# Patient Record
Sex: Female | Born: 1960 | Race: Black or African American | Hispanic: No | Marital: Married | Smoking: Former smoker
Health system: Southern US, Community
[De-identification: ages and names within clinical notes are randomized; demographics above are authoritative.]

## PROBLEM LIST (undated history)

## (undated) DIAGNOSIS — E7800 Pure hypercholesterolemia, unspecified: Secondary | ICD-10-CM

## (undated) DIAGNOSIS — I10 Essential (primary) hypertension: Secondary | ICD-10-CM

## (undated) DIAGNOSIS — K219 Gastro-esophageal reflux disease without esophagitis: Secondary | ICD-10-CM

## (undated) HISTORY — PX: TUBAL LIGATION: SHX77

## (undated) HISTORY — PX: BREAST BIOPSY: SHX20

---

## 2006-01-19 ENCOUNTER — Ambulatory Visit: Payer: Self-pay | Admitting: Internal Medicine

## 2006-05-02 ENCOUNTER — Emergency Department: Payer: Self-pay | Admitting: Emergency Medicine

## 2006-09-28 ENCOUNTER — Ambulatory Visit: Payer: Self-pay | Admitting: Surgery

## 2007-03-08 ENCOUNTER — Emergency Department: Payer: Self-pay | Admitting: Emergency Medicine

## 2007-04-28 ENCOUNTER — Ambulatory Visit: Payer: Self-pay

## 2007-12-16 ENCOUNTER — Ambulatory Visit: Payer: Self-pay | Admitting: Gastroenterology

## 2008-05-01 ENCOUNTER — Ambulatory Visit: Payer: Self-pay | Admitting: Internal Medicine

## 2009-05-09 ENCOUNTER — Ambulatory Visit: Payer: Self-pay | Admitting: Internal Medicine

## 2009-11-29 ENCOUNTER — Ambulatory Visit: Payer: Self-pay | Admitting: Ophthalmology

## 2010-10-10 ENCOUNTER — Ambulatory Visit: Payer: Self-pay | Admitting: Family Medicine

## 2011-11-19 ENCOUNTER — Ambulatory Visit: Payer: Self-pay | Admitting: Internal Medicine

## 2012-07-02 ENCOUNTER — Ambulatory Visit: Payer: Self-pay | Admitting: Family Medicine

## 2012-12-24 DIAGNOSIS — R079 Chest pain, unspecified: Secondary | ICD-10-CM | POA: Insufficient documentation

## 2013-01-13 ENCOUNTER — Observation Stay: Payer: Self-pay | Admitting: Internal Medicine

## 2013-01-13 LAB — BASIC METABOLIC PANEL
Anion Gap: 6 — ABNORMAL LOW (ref 7–16)
BUN: 20 mg/dL — ABNORMAL HIGH (ref 7–18)
Calcium, Total: 9.6 mg/dL (ref 8.5–10.1)
Chloride: 105 mmol/L (ref 98–107)
Co2: 27 mmol/L (ref 21–32)
Creatinine: 0.92 mg/dL (ref 0.60–1.30)
EGFR (African American): >60
EGFR (Non-African Amer.): >60
Glucose: 98 mg/dL (ref 65–99)
Osmolality: 278 (ref 275–301)
Potassium: 3.4 mmol/L — ABNORMAL LOW (ref 3.5–5.1)
Sodium: 138 mmol/L (ref 136–145)

## 2013-01-13 LAB — CBC
HCT: 34.5 % — ABNORMAL LOW (ref 35.0–47.0)
MCHC: 33.2 g/dL (ref 32.0–36.0)
Platelet: 339 10*3/uL (ref 150–440)
RBC: 5.03 10*6/uL (ref 3.80–5.20)
RDW: 33.7 % — ABNORMAL HIGH (ref 11.5–14.5)
WBC: 6 10*3/uL (ref 3.6–11.0)

## 2013-01-14 LAB — TROPONIN I: Troponin-I: <0.02 ng/mL

## 2013-01-14 LAB — CK TOTAL AND CKMB (NOT AT ARMC): CK-MB: 0.7 ng/mL (ref 0.5–3.6)

## 2013-01-17 ENCOUNTER — Ambulatory Visit: Payer: Self-pay | Admitting: Family Medicine

## 2013-05-26 HISTORY — PX: COLONOSCOPY: SHX174

## 2013-05-27 LAB — HM COLONOSCOPY: HM Colonoscopy: NORMAL

## 2013-11-23 DIAGNOSIS — D649 Anemia, unspecified: Secondary | ICD-10-CM | POA: Insufficient documentation

## 2014-01-24 LAB — HM PAP SMEAR: HM Pap smear: NORMAL

## 2014-05-09 ENCOUNTER — Ambulatory Visit: Payer: Self-pay | Admitting: Internal Medicine

## 2014-05-11 ENCOUNTER — Ambulatory Visit: Payer: Self-pay | Admitting: Gastroenterology

## 2014-05-15 ENCOUNTER — Ambulatory Visit: Payer: Self-pay | Admitting: Internal Medicine

## 2014-05-23 ENCOUNTER — Ambulatory Visit: Payer: Self-pay | Admitting: Internal Medicine

## 2014-05-23 LAB — HM MAMMOGRAPHY

## 2014-09-06 DIAGNOSIS — M1990 Unspecified osteoarthritis, unspecified site: Secondary | ICD-10-CM | POA: Insufficient documentation

## 2014-09-06 DIAGNOSIS — R569 Unspecified convulsions: Secondary | ICD-10-CM | POA: Insufficient documentation

## 2014-09-08 ENCOUNTER — Other Ambulatory Visit: Payer: Self-pay | Admitting: Surgery

## 2014-09-08 DIAGNOSIS — N63 Unspecified lump in unspecified breast: Secondary | ICD-10-CM

## 2014-09-08 DIAGNOSIS — I10 Essential (primary) hypertension: Secondary | ICD-10-CM | POA: Insufficient documentation

## 2014-09-08 DIAGNOSIS — F32A Depression, unspecified: Secondary | ICD-10-CM | POA: Insufficient documentation

## 2014-09-08 DIAGNOSIS — F329 Major depressive disorder, single episode, unspecified: Secondary | ICD-10-CM | POA: Insufficient documentation

## 2014-09-15 NOTE — Discharge Summary (Signed)
PATIENT NAME:  Bianca Wood, Bianca Wood MR#:  [id number] DATE OF BIRTH:  1961/01/04  DATE OF ADMISSION:  01/13/2013 DATE OF DISCHARGE:  01/14/2013  PRESENTING COMPLAINT: Chest pain.   DISCHARGE DIAGNOSES:  1. Chest pain, appears noncardiac.  2. Hypertension.   PROCEDURES: Treadmill stress test: Negative.   CODE STATUS: Full code.   MEDICATIONS:  1. Hydrochlorothiazide 25 mg daily.  2. Quinapril 40 mg daily.  3. Amlodipine 10 mg daily.  4. Losartan 100 mg daily.   DIET: Low sodium.   FOLLOWUP: With Dr. Gayland Curry in 1 to 2 weeks.  DIAGNOSTIC STUDIES: Cardiac enzymes x3 negative.  EKG shows normal sinus rhythm with incomplete right bundle branch block.  H and H is 11.5 and 34.5, white count is 6.0, MCV 69.  Potassium is 3.4, BUN is 20, creatinine is 0.9, sodium is 138.   BRIEF SUMMARY OF HOSPITAL COURSE: Bianca Wood is a pleasant 54 year old African-American female with history of hypertension, who came in with:   1. Chest pain. The patient's workup for chest pain remained negative. She did not have any further episodes. Cardiac enzymes remained negative. She had a normal treadmill stress test. Her chest pain appears noncardiac, most likely related to stress.  2. Hypertension. All home medications were resumed.  3. Iron deficiency anemia, suspected from menorrhagia from known history of uterine fibroids. The patient is advised to follow up with her gynecologist.  [facility] stay otherwise remained stable.   CODE STATUS: The patient remained a full code.   TIME SPENT: 40 minutes.   ____________________________ Hart Rochester Posey Pronto, MD sap:OSi D: 01/15/2013 07:16:27 ET T: 01/15/2013 08:31:27 ET JOB#: [id number]  cc: Leoma Folds A. Posey Pronto, MD, <Dictator> Floria Raveling. Astrid Divine, MD Ilda Basset MD ELECTRONICALLY SIGNED 01/27/2013 62:83

## 2014-09-15 NOTE — H&P (Signed)
PATIENT NAME:  Bianca Wood, Bianca Wood MR#:  [id number] DATE OF BIRTH:  08/16/1960  DATE OF ADMISSION:  01/13/2013  PRIMARY PHYSICIAN:  Dr. Gayland Curry  CHIEF COMPLAINT:  Chest pain.   HISTORY OF PRESENT ILLNESS:  A 54 year old female with history of hypertension, noticed to have chest pain, mainly in the middle of the chest, going across the chest to her left shoulder. This was going on for about 1 to 2 months. The patient went to her primary doctor, Gayland Curry, 2 weeks ago, had a physical done and supposed to have a stress test in 2 weeks. The patient does not remember the cardiologist's name. The patient since then is having on and off chest pain[place] Whenever she gets chest pressure she usually is  at home, and the chest pain would last for about 5 minutes and after that it resolved, but this happened today while she was driving back from work, and noticed heaviness in the chest with slight nausea and shortness of breath, so she came to Emergency Room. In the ER, workup is negative, but because of her chest pain and the [place], the patient is referred for admission. The patient right now is chest pain free. Received aspirin in the Emergency Room. Denies any orthopnea or PND. No palpitations.   PAST MEDICAL HISTORY:  Significant for hypertension.   PAST SURGICAL HISTORY:  Significant for tubal ligation.   MEDICATIONS: Takes hydrochlorothiazide. The patient does not remember the dose.   FAMILY HISTORY:  Significant for heart disease. The patient's mother died at the age of 13 because of heart attack. Maternal grandmother died of heart attack, and paternal grandmother died of heart attack.   SOCIAL HISTORY:  No smoking. No drinking. No drugs. Lives with her husband works at [employer].>   REVIEW OF SYSTEMS: CONSTITUTIONAL:  The patient has no fever. No fatigue.  EYES:  No blurred vision.  EARS, NOSE, THROAT:  No tinnitus. No ear pain. No  epistaxis.  RESPIRATORY:  No cough. No wheezing.  CARDIOVASCULAR:  Has chest pain on and off going on for about 2 months.  GASTROINTESTINAL:  No nausea. No vomiting. No abdominal pain.  GENITOURINARY:  No dysuria.  ENDOCRINE:  No polyuria or nocturia.  INTEGUMENTARY:  No skin rashes.  MUSCULOSKELETAL:  No joint pains.  NEUROLOGIC:  No numbness or weakness. No TIA. PSYCHIATRIC:  No anxiety or insomnia.   PHYSICAL EXAMINATION: VITAL SIGNS:  Temperature 98.2, heart rate 87, blood pressure 146/70, respirations 20, sats 96% on room air.  GENERAL:  This is a well-developed, well-nourished African-American female not in distress, answering questions appropriately.  HEENT: Head normocephalic, atraumatic. Pupils equally reacting to light. Extraocular movements are intact. No conjunctival icterus.  NOSE:  No turbinate hypertrophy.  EARS:  No tympanic membrane congestion or.  MOUTH:  No oropharyngeal erythema.  NECK:  Supple. No JVD. No carotid bruit. No lymphadenopathy.  RESPIRATORY:  Clear to auscultation. No wheeze. No rales. Not using accessory muscles of respiration.  CARDIOVASCULAR:  S1, S2. Regular. No murmurs. PMI not displaced. Pulses equal at carotid, femoral and pedal pulses. No peripheral edema.  GASTROINTESTINAL: Abdomen is soft, nontender, obese. Bowel sounds present. No organomegaly. No CVA tenderness.  MUSCULOSKELETAL: Normal gait and station. SKIN:  Normal. LYMPH NODES:  No cervical lymphadenopathy. No axillary lymphadenopathy.  NEUROLOGIC:  Cranial nerves II through XII are intact. Power 5/5 upper and lower extremities. Sensation is intact. DTRs 2+ bilaterally.  PSYCHIATRIC:  Mood and affect are within normal limits.   LABORATORY DATA:  WBC 6, hemoglobin 11.5, hematocrit 34.5, platelets 339.  Electrolytes: Sodium 138, potassium 3.4, chloride 105, bicarb 27, BUN 20, creatinine 0.92, glucose 98. Troponin less than 0.02. EKG:  Normal sinus rhythm at 86 beats per minute. No ST-T  changes.   ASSESSMENT AND PLAN: This is a 54 year old female patient with chest pressure going on for over 2 months, with negative troponins, with a strong family history. Admit to observation status under chest pain, rule out likely any acute coronary syndrome. Because of her hypertension history and her age, keep her overnight, continue to cycle troponins 2 more times, obtain a stress test in the morning. Until then continue aspirin, beta blockers, nitroglycerin as needed, and also Lovenox at 40 mg subcu daily. Gastrointestinal prophylaxis with proton pump inhibitor.   TIME SPENT:  About 50 minutes.     ____________________________ Epifanio Lesches, MD sk:mr D: 01/13/2013 19:13:13 ET T: 01/13/2013 20:06:21 ET JOB#: [id number]  cc: Epifanio Lesches, MD, <Dictator> Floria Raveling. Astrid Divine, MD  Epifanio Lesches MD ELECTRONICALLY SIGNED 01/14/2013 12:51

## 2014-09-15 NOTE — Consult Note (Signed)
PATIENT NAME:  Bianca Wood, Bianca Wood MR#:  [id number] DATE OF BIRTH:  1960/06/25  DATE OF CONSULTATION:  01/14/2013  CONSULTING PHYSICIAN:  Dionisio David, MD  HISTORY OF PRESENT ILLNESS: This is a 54 year old African-American female with a past medical history of hypertension, who came into the hospital with chest pain. The chest pain was in the center of her chest radiating to the left shoulder. It has been going on for over a month or two. She normally sees Dr. Gayland Curry. She underwent regular stress test ordered by hospitalist after ruling out for a myocardial infarction. The regular stress test was normal, with no evidence of any ST depression. Baseline heart rate was 64. She achieved a peak heart rate over 100% without any significant symptom or ST depression.   PAST MEDICAL HISTORY:  1. History of hypertension.  2. History of tubal ligation.    MEDICATIONS: She cannot remember, but she takes hydrochlorothiazide and 2 other blood pressure medicines.   FAMILY HISTORY: Her mother died at age 60 of MRI.   SOCIAL HISTORY: She denies EtOH abuse or smoking.   PHYSICAL EXAMINATION:  GENERAL: She is alert and oriented x3, in no acute distress.  VITAL SIGNS: Her blood pressure is 132/81, pulse 66, respirations 20.  NECK: Revealed no JVD.  LUNGS: Clear.  HEART: Regular rate and rhythm. Normal S1, S2. No audible murmur.  ABDOMEN: Soft, nontender. Positive bowel sounds.  EXTREMITIES: No pedal edema.   DIAGNOSTIC DATA: EKG shows normal sinus rhythm, 86 beats per minute, incomplete right bundle branch block. Cardiac enzymes: The first set is negative. Potassium was low at 3.4. Rest of the labs were normal.   ASSESSMENT AND PLAN: Atypical chest pain. Regular stress test is normal, with good exercise capacity. Walked over 5 to 6 minutes and achieved 100% predicted heart rate without any ST depression. No chest pain. Advise discharging the patient with followup in the office on Monday at 10:00  a.m.   Thank you very much for referral.   ____________________________ Dionisio David, MD sak:OSi D: 01/14/2013 08:56:45 ET T: 01/14/2013 09:23:08 ET JOB#: [id number]  cc: Dionisio David, MD, <Dictator> Dionisio David MD ELECTRONICALLY SIGNED 02/22/2013 11:43

## 2014-09-18 LAB — SURGICAL PATHOLOGY

## 2014-10-18 ENCOUNTER — Other Ambulatory Visit: Payer: Self-pay

## 2014-10-21 DIAGNOSIS — E559 Vitamin D deficiency, unspecified: Secondary | ICD-10-CM | POA: Insufficient documentation

## 2015-03-18 ENCOUNTER — Encounter: Payer: Self-pay | Admitting: Internal Medicine

## 2015-03-18 DIAGNOSIS — E079 Disorder of thyroid, unspecified: Secondary | ICD-10-CM | POA: Insufficient documentation

## 2015-03-18 DIAGNOSIS — D251 Intramural leiomyoma of uterus: Secondary | ICD-10-CM | POA: Insufficient documentation

## 2015-03-26 ENCOUNTER — Encounter: Payer: Self-pay | Admitting: Internal Medicine

## 2015-03-26 DIAGNOSIS — K219 Gastro-esophageal reflux disease without esophagitis: Secondary | ICD-10-CM | POA: Insufficient documentation

## 2015-03-26 DIAGNOSIS — Z800 Family history of malignant neoplasm of digestive organs: Secondary | ICD-10-CM | POA: Insufficient documentation

## 2015-07-03 ENCOUNTER — Ambulatory Visit
Admission: EM | Admit: 2015-07-03 | Discharge: 2015-07-03 | Disposition: A | Discharge status: Home / Self Care | Payer: BLUE CROSS/BLUE SHIELD | Attending: Family Medicine | Admitting: Family Medicine

## 2015-07-03 DIAGNOSIS — R079 Chest pain, unspecified: Secondary | ICD-10-CM

## 2015-07-03 HISTORY — DX: Gastro-esophageal reflux disease without esophagitis: K21.9

## 2015-07-03 HISTORY — DX: Pure hypercholesterolemia, unspecified: E78.00

## 2015-07-03 HISTORY — DX: Essential (primary) hypertension: I10

## 2015-07-03 MED ORDER — ASPIRIN 81 MG PO CHEW
324.0000 mg | CHEWABLE_TABLET | Freq: Once | ORAL | Status: AC
  Administered 2015-07-03: 81 mg via ORAL

## 2015-07-03 NOTE — ED Provider Notes (Addendum)
CSN: [id number]     Arrival date & time 07/03/15  1629 History   None   Nurses notes were reviewed. Chief Complaint  Patient presents with  . Chest Pain    Chest pain started a few minutes after she finished exercising. She states the pain here in the anterior chest and traveled to the back. She reports being diaphoretic and lightheaded. After while the pain did clear. She came here to be evaluated for chest pain. She has a history of hypertension and elevated cholesterol. She does not smoke. She has had GERD and is a history of coronary artery disease diabetes in her mother and cancer father her sister had colon cancer as well.   She reports having similar episode chest pain like this about a year ago and was seen at [facility]. They kept overnight for serial cardiac enzymes she has a stress test which portion was normal. She does had a cholesterol checked yesterday. And saw her PCP for her wellness exam she states that she's been having some twinges of pain for about the last week but nothing this bad nothing the hip and lasted this long.  Denies a sudden death in the family.     (Consider location/radiation/quality/duration/timing/severity/associated sxs/prior Treatment) Patient is a 55 y.o. female presenting with chest pain. The history is provided by the patient. No language interpreter was used.  Chest Pain Pain location:  Substernal area Pain radiates to:  Mid back Pain radiates to the back: yes   Pain severity:  Moderate Onset quality:  Sudden Progression:  Partially resolved Chronicity:  New Context comment:  After vigorous exercise Relieved by:  Nothing Worsened by:  Nothing tried Associated symptoms comment:  History of esophageal reflux the past but this pain did not feel like that Risk factors: high cholesterol, hypertension and obesity     Past Medical History  Diagnosis Date  . Hypertension   . Hypercholesteremia   . GERD (gastroesophageal reflux disease)    Past  Surgical History  Procedure Laterality Date  . Tubal ligation    . Breast biopsy    . Colonoscopy  2015    normal   Family History  Problem Relation Age of Onset  . Diabetes Mother   . CAD Mother   . Colon cancer Sister   . Cancer Father    Social History  Substance Use Topics  . Smoking status: Never Smoker   . Smokeless tobacco: None  . Alcohol Use: No   OB History    No data available     Review of Systems  Cardiovascular: Positive for chest pain.  All other systems reviewed and are negative.   Allergies  Review of patient's allergies indicates no known allergies.  Home Medications   Prior to Admission medications   Medication Sig Start Date End Date Taking? Authorizing Provider  amLODipine-benazepril (LOTREL) 10-20 MG capsule Take 1 capsule by mouth daily.   Yes Historical Provider, MD  fluticasone (FLONASE) 50 MCG/ACT nasal spray Place 1 spray into both nostrils daily.   Yes Historical Provider, MD  hydrochlorothiazide (HYDRODIURIL) 25 MG tablet Take 25 mg by mouth daily.   Yes Historical Provider, MD  losartan (COZAAR) 100 MG tablet Take 100 mg by mouth daily.   Yes Historical Provider, MD  ranitidine (ZANTAC) 150 MG capsule Take 150 mg by mouth 2 (two) times daily.   Yes Historical Provider, MD   Meds Ordered and Administered this Visit   Medications  aspirin chewable tablet 324 mg (81  mg Oral Given 07/03/15 1647)    BP 155/85 mmHg  Pulse 90  Temp(Src) 98.4 F (36.9 C) (Tympanic)  Resp 16  Ht 5' 6.5" (1.689 m)  Wt 260 lb (117.935 kg)  BMI 41.34 kg/m2  SpO2 96% No data found.   Physical Exam  Constitutional: She is oriented to person, place, and time. She appears well-developed and well-nourished.  HENT:  Head: Normocephalic and atraumatic.  Eyes: Pupils are equal, round, and reactive to light.  Neck: Normal range of motion. Neck supple. No thyromegaly present.  Cardiovascular: Normal rate, regular rhythm and normal heart sounds.    Pulmonary/Chest: Effort normal and breath sounds normal. No respiratory distress.  Abdominal: Soft.  Musculoskeletal: Normal range of motion. She exhibits no edema or tenderness.  Lymphadenopathy:    She has no cervical adenopathy.  Neurological: She is alert and oriented to person, place, and time. No cranial nerve deficit.  Skin: Skin is warm and dry. No erythema.  Psychiatric: She has a normal mood and affect.  Vitals reviewed.   ED Course  Procedures (including critical care time)  Labs Review Labs Reviewed - No data to display  Imaging Review No results found.   Visual Acuity Review  Right Eye Distance:   Left Eye Distance:   Bilateral Distance:    Right Eye Near:   Left Eye Near:    Bilateral Near:         MDM   1. Chest pain, unspecified chest pain type     Explained to patient because of her age the pain occurring after exercise and having some pain earlier but this being the worse at this point time I recommend that she go to the emergency room [place] to be evaluated and to possibly have a serial cardiac enzymes done. Patient/will prefer to go to [facility]. Sprain to her that she will screw [facility] not sure if EMS will take her since she does not have a cardiologist stab wrist. She is contact her family members and they will take her. Sprain to her that we prefer by rescue squad but same time her EKG looks stable at this point for not argue with any further on this issue. She was given 4 baby aspirins here before she left. And painless much better on leaving  ED ECG REPORT I, Kessler Solly H, the attending physician, personally viewed and interpreted this ECG.   Date 07/03/2015  EKG Time: 16:41:13  Rate:94  Rhythm: there are no previous tracings available for comparison, normal sinus rhythm, left atrial enlargement  Axis: 60  Intervals:none  ST&T Change:none  Note: This dictation was prepared with Dragon dictation along with smaller phrase technology. Any  transcriptional errors that result from this process are unintentional. Frederich Cha, MD 07/03/15 1907  Frederich Cha, MD 07/03/15 [phone]

## 2015-07-03 NOTE — Discharge Instructions (Signed)
Chest Pain Observation °It is often hard to give a specific diagnosis for the cause of chest pain. Among other possibilities your symptoms might be caused by inadequate oxygen delivery to your heart (angina). Angina that is not treated or evaluated can lead to a heart attack (myocardial infarction) or death. °Blood tests, electrocardiograms, and X-rays may have been done to help determine a possible cause of your chest pain. After evaluation and observation, your health care provider has determined that it is unlikely your pain was caused by an unstable condition that requires hospitalization. However, a full evaluation of your pain may need to be completed, with additional diagnostic testing as directed. It is very important to keep your follow-up appointments. Not keeping your follow-up appointments could result in permanent heart damage, disability, or death. If there is any problem keeping your follow-up appointments, you must call your health care provider. °HOME CARE INSTRUCTIONS  °Due to the slight chance that your pain could be angina, it is important to follow your health care provider's treatment plan and also maintain a healthy lifestyle: °· Maintain or work toward achieving a healthy weight. °· Stay physically active and exercise regularly. °· Decrease your salt intake. °· Eat a balanced, healthy diet. Talk to a dietitian to learn about heart-healthy foods. °· Increase your fiber intake by including whole grains, vegetables, fruits, and nuts in your diet. °· Avoid situations that cause stress, anger, or depression. °· Take medicines as advised by your health care provider. Report any side effects to your health care provider. Do not stop medicines or adjust the dosages on your own. °· Quit smoking. Do not use nicotine patches or gum until you check with your health care provider. °· Keep your blood pressure, blood sugar, and cholesterol levels within normal limits. °· Limit alcohol intake to no more than  1 drink per day for women who are not pregnant and 2 drinks per day for men. °· Do not abuse drugs. °SEEK IMMEDIATE MEDICAL CARE IF: °You have severe chest pain or pressure which may include symptoms such as: °· You feel pain or pressure in your arms, neck, jaw, or back. °· You have severe back or abdominal pain, feel sick to your stomach (nauseous), or throw up (vomit). °· You are sweating profusely. °· You are having a fast or irregular heartbeat. °· You feel short of breath while at rest. °· You notice increasing shortness of breath during rest, sleep, or with activity. °· You have chest pain that does not get better after rest or after taking your usual medicine. °· You wake from sleep with chest pain. °· You are unable to sleep because you cannot breathe. °· You develop a frequent cough or you are coughing up blood. °· You feel dizzy, faint, or experience extreme fatigue. °· You develop severe weakness, dizziness, fainting, or chills. °Any of these symptoms may represent a serious problem that is an emergency. Do not wait to see if the symptoms will go away. Call your local emergency services (911 in the [place] Do not drive yourself to the hospital. °MAKE SURE YOU: °· Understand these instructions. °· Will watch your condition. °· Will get help right away if you are not doing well or get worse. °  °This information is not intended to replace advice given to you by your health care provider. Make sure you discuss any questions you have with your health care provider. °  °Document Released: 06/14/2010 Document Revised: 05/17/2013 Document Reviewed: 11/11/2012 °Elsevier Interactive Patient   Education ©2016 [employer] ° ° °Aspirin and Your Heart ° Aspirin is a medicine that affects the way blood clots. Aspirin can be used to help reduce the risk of blood clots, heart attacks, and other heart-related problems.  °SHOULD I TAKE ASPIRIN? °Your health care provider will help you determine whether it is safe and  beneficial for you to take aspirin daily. Taking aspirin daily may be beneficial if you: °· Have had a heart attack or chest pain. °· Have undergone open heart surgery such as coronary artery bypass surgery (CABG). °· Have had coronary angioplasty. °· Have experienced a stroke or transient ischemic attack (TIA). °· Have peripheral vascular disease (PVD). °· Have chronic heart rhythm problems such as atrial fibrillation. °ARE THERE ANY RISKS OF TAKING ASPIRIN DAILY? °Daily use of aspirin can increase your risk of side effects. Some of these include: °· Bleeding. Bleeding problems can be minor or serious. An example of a minor problem is a cut that does not stop bleeding. An example of a more serious problem is stomach bleeding or bleeding into the brain. Your risk of bleeding is increased if you are also taking non-steroidal anti-inflammatory medicine (NSAIDs). °· Increased bruising. °· Upset stomach. °· An allergic reaction. People who have nasal polyps have an increased risk of developing an aspirin allergy. °WHAT ARE SOME GUIDELINES I SHOULD FOLLOW WHEN TAKING ASPIRIN?  °· Take aspirin only as directed by your health care provider. Make sure you understand how much you should take and what form you should take. The two forms of aspirin are: °¨ Non-enteric-coated. This type of aspirin does not have a coating and is absorbed quickly. Non-enteric-coated aspirin is usually recommended for people with chest pain. This type of aspirin also comes in a chewable form. °¨ Enteric-coated. This type of aspirin has a special coating that releases the medicine very slowly. Enteric-coated aspirin causes less stomach upset than non-enteric-coated aspirin. This type of aspirin should not be chewed or crushed. °· Drink alcohol in moderation. Drinking alcohol increases your risk of bleeding. °WHEN SHOULD I SEEK MEDICAL CARE?  °· You have unusual bleeding or bruising. °· You have stomach pain. °· You have an allergic reaction.  Symptoms of an allergic reaction include: °¨ Hives. °¨ Itchy skin. °¨ Swelling of the lips, tongue, or face. °· You have ringing in your ears. °WHEN SHOULD I SEEK IMMEDIATE MEDICAL CARE?  °· Your bowel movements are bloody, dark red, or black in color. °· You vomit or cough up blood. °· You have blood in your urine. °· You cough, wheeze, or feel short of breath. °If you have any of the following symptoms, this is an emergency. Do not wait to see if the pain will go away. Get medical help at once. Call your local emergency services (911 in the [place] Do not drive yourself to the hospital. °· You have severe chest pain, especially if the pain is crushing or pressure-like and spreads to the arms, back, neck, or jaw.  °· You have stroke-like symptoms, such as:   °¨ Loss of vision.   °¨ Difficulty talking.   °¨ Numbness or weakness on one side of your body.   °¨ Numbness or weakness in your arm or leg.   °¨ Not thinking clearly or feeling confused.   °  °This information is not intended to replace advice given to you by your health care provider. Make sure you discuss any questions you have with your health care provider. °  °Document Released: 04/24/2008 Document Revised: 06/02/2014 Document Reviewed:   Elsevier Interactive Patient Education ©2016 Elsevier Inc. ° °

## 2015-07-03 NOTE — ED Notes (Signed)
Went to the gym today, and 15 - 20 minutes after working out developed mid sternal chest pain radiating through to back. States was diaphoretic but "might have been from working out".  Lasted 20 minutes being intermittent.  No pain since. Similar episode 1 year ago and seen at [facility] not find anything wrong". Skin warm and dry to touch

## 2015-11-05 ENCOUNTER — Emergency Department
Admission: EM | Admit: 2015-11-05 | Discharge: 2015-11-05 | Disposition: A | Discharge status: Home / Self Care | Payer: No Typology Code available for payment source | Attending: Emergency Medicine | Admitting: Emergency Medicine

## 2015-11-05 ENCOUNTER — Emergency Department: Payer: No Typology Code available for payment source

## 2015-11-05 ENCOUNTER — Encounter: Payer: Self-pay | Admitting: Emergency Medicine

## 2015-11-05 DIAGNOSIS — S29012A Strain of muscle and tendon of back wall of thorax, initial encounter: Secondary | ICD-10-CM

## 2015-11-05 DIAGNOSIS — Y9389 Activity, other specified: Secondary | ICD-10-CM | POA: Insufficient documentation

## 2015-11-05 DIAGNOSIS — M25461 Effusion, right knee: Secondary | ICD-10-CM | POA: Insufficient documentation

## 2015-11-05 DIAGNOSIS — M25561 Pain in right knee: Secondary | ICD-10-CM | POA: Diagnosis present

## 2015-11-05 DIAGNOSIS — Y999 Unspecified external cause status: Secondary | ICD-10-CM | POA: Diagnosis not present

## 2015-11-05 DIAGNOSIS — I10 Essential (primary) hypertension: Secondary | ICD-10-CM | POA: Insufficient documentation

## 2015-11-05 DIAGNOSIS — Z8669 Personal history of other diseases of the nervous system and sense organs: Secondary | ICD-10-CM | POA: Insufficient documentation

## 2015-11-05 DIAGNOSIS — Y92410 Unspecified street and highway as the place of occurrence of the external cause: Secondary | ICD-10-CM | POA: Diagnosis not present

## 2015-11-05 DIAGNOSIS — S233XXA Sprain of ligaments of thoracic spine, initial encounter: Secondary | ICD-10-CM | POA: Diagnosis not present

## 2015-11-05 DIAGNOSIS — Z79899 Other long term (current) drug therapy: Secondary | ICD-10-CM | POA: Insufficient documentation

## 2015-11-05 DIAGNOSIS — F329 Major depressive disorder, single episode, unspecified: Secondary | ICD-10-CM | POA: Insufficient documentation

## 2015-11-05 DIAGNOSIS — M1990 Unspecified osteoarthritis, unspecified site: Secondary | ICD-10-CM | POA: Diagnosis not present

## 2015-11-05 MED ORDER — KETOROLAC TROMETHAMINE 30 MG/ML IJ SOLN
30.0000 mg | Freq: Once | INTRAMUSCULAR | Status: AC
  Administered 2015-11-05: 30 mg via INTRAVENOUS
  Filled 2015-11-05: qty 1

## 2015-11-05 MED ORDER — CYCLOBENZAPRINE HCL 10 MG PO TABS: 10.0000 mg | ORAL_TABLET | Freq: Three times a day (TID) | ORAL | Status: DC | PRN

## 2015-11-05 MED ORDER — IBUPROFEN 800 MG PO TABS: 800.0000 mg | ORAL_TABLET | Freq: Three times a day (TID) | ORAL | Status: DC | PRN
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
## 2017-01-16 ENCOUNTER — Ambulatory Visit: Payer: Self-pay | Admitting: Urology

## 2017-04-12 ENCOUNTER — Other Ambulatory Visit: Payer: Self-pay

## 2017-04-12 ENCOUNTER — Ambulatory Visit (INDEPENDENT_AMBULATORY_CARE_PROVIDER_SITE_OTHER): Payer: Managed Care, Other (non HMO)

## 2017-04-12 ENCOUNTER — Ambulatory Visit
Admission: EM | Admit: 2017-04-12 | Discharge: 2017-04-12 | Disposition: A | Discharge status: Home / Self Care | Payer: Managed Care, Other (non HMO) | Attending: Family Medicine | Admitting: Family Medicine

## 2017-04-12 ENCOUNTER — Encounter: Payer: Self-pay | Admitting: Emergency Medicine

## 2017-04-12 DIAGNOSIS — M7541 Impingement syndrome of right shoulder: Secondary | ICD-10-CM | POA: Diagnosis not present

## 2017-04-12 DIAGNOSIS — M25511 Pain in right shoulder: Secondary | ICD-10-CM

## 2017-04-12 MED ORDER — NAPROXEN 500 MG PO TABS: 500.0000 mg | ORAL_TABLET | Freq: Two times a day (BID) | ORAL | 0 refills | Status: DC

## 2017-04-12 NOTE — ED Provider Notes (Signed)
[facility]    CSN: [id number] Arrival date & time: 04/12/17  1001     History   Chief Complaint Chief Complaint  Patient presents with  . Arm Pain    right arm    HPI Bianca Wood is a 56 y.o. female.   HPI  Is a 56 year old female presents with right shoulder pain she's had for approximately 2 weeks. 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 She is left-hand dominant. Denies any neck pain. She has no upper extremity radicular symptoms. She states that any movement away from her side and also flexion or extension of the shoulder is very uncomfortable. She is able to localize the pain to the posterior area.         Past Medical History:  Diagnosis Date  . GERD (gastroesophageal reflux disease)   . Hypercholesteremia   . Hypertension     Patient Active Problem List   Diagnosis Date Noted  . Gastro-esophageal reflux disease without esophagitis 03/26/2015  . Family history of colon cancer 03/26/2015  . Fibroids, intramural 03/18/2015  . Disease of thyroid gland 03/18/2015  . Avitaminosis D 10/21/2014  . Clinical depression 09/08/2014  . Essential (primary) hypertension 09/08/2014  . Breast lump 09/08/2014  . Arthritis 09/06/2014  . Seizure ([place]) 09/06/2014  . Absolute anemia 11/23/2013  . Chest pain 12/24/2012    Past Surgical History:  Procedure Laterality Date  . BREAST BIOPSY    . COLONOSCOPY  2015   normal  . TUBAL LIGATION      OB History    No data available       Home Medications    Prior to Admission medications   Medication Sig Start Date End Date Taking? Authorizing Provider  amLODipine-benazepril (LOTREL) 10-20 MG capsule Take 1 capsule by mouth daily.   Yes [provider]  cyclobenzaprine (FLEXERIL) 10 MG tablet Take 1 tablet (10 mg total) by mouth every 8 (eight) hours as needed for muscle spasms. 11/05/15  Yes Beers, Pierce Crane, PA-C    hydrochlorothiazide (HYDRODIURIL) 25 MG tablet Take 25 mg by mouth daily.   Yes [provider]  losartan (COZAAR) 100 MG tablet Take 100 mg by mouth daily.   Yes [provider]  ibuprofen (ADVIL,MOTRIN) 800 MG tablet Take 1 tablet (800 mg total) by mouth every 8 (eight) hours as needed. 11/05/15   Beers, Pierce Crane, PA-C  naproxen (NAPROSYN) 500 MG tablet Take 1 tablet (500 mg total) 2 (two) times daily with a meal by mouth. 04/12/17   Lorin Picket, PA-C    Family History Family History  Problem Relation Age of Onset  . Diabetes Mother   . CAD Mother   . Colon cancer Sister   . Cancer Father     Social History Social History   Tobacco Use  . Smoking status: Former Research scientist (life sciences)  . Smokeless tobacco: Never Used  Substance Use Topics  . Alcohol use: No    Alcohol/week: 0.0 oz  . Drug use: No     Allergies   Patient has no known allergies.   Review of Systems Review of Systems  Constitutional: Positive for activity change. Negative for chills, fatigue and fever.  Musculoskeletal: Positive for myalgias. Negative for neck pain and neck stiffness.  All other systems reviewed and are negative.    Physical Exam Triage Vital Signs ED Triage Vitals  Enc Vitals Group  BP 04/12/17 1023 (!) 157/85     Pulse Rate 04/12/17 1023 80     Resp 04/12/17 1023 16     Temp 04/12/17 1023 97.8 F (36.6 C)     Temp Source 04/12/17 1023 Oral     SpO2 04/12/17 1023 99 %     Weight 04/12/17 1021 268 lb (121.6 kg)     Height 04/12/17 1021 5' 6.5" (1.689 m)     Head Circumference --      Peak Flow --      Pain Score 04/12/17 1021 7     Pain Loc --      Pain Edu? --      Excl. in [place]? --    No data found.  Updated Vital Signs BP (!) 157/85 (BP Location: Left Arm)   Pulse 80   Temp 97.8 F (36.6 C) (Oral)   Resp 16   Ht 5' 6.5" (1.689 m)   Wt 268 lb (121.6 kg)   SpO2 99%   BMI 42.61 kg/m   Visual Acuity Right Eye Distance:   Left Eye Distance:    Bilateral Distance:    Right Eye Near:   Left Eye Near:    Bilateral Near:     Physical Exam  Constitutional: She is oriented to person, place, and time. She appears well-developed and well-nourished. No distress.  HENT:  Head: Normocephalic and atraumatic.  Eyes: Pupils are equal, round, and reactive to light.  Neck: Normal range of motion. Neck supple.  Musculoskeletal: She exhibits tenderness.  Examination of the right shoulder shows that decreased range of motion limited by pain. This is most noticeable with abduction forward flexion extension and external rotation. She has tenderness over the posterior acromion. She has a positive arm drop sign on the right but does exhibit good control. Empty can test on the right shows her able to hold against gravity as well as resisted forward flexion with the pain localized over the posterior acromial area. She has no acromioclavicular joint tenderness. She has no clavicular tenderness. Neck range of motion is full and comfortable. There is no tenderness of the paraspinous muscles. Vascular/neuro examination distal to the shoulder on the right is normal.  Neurological: She is alert and oriented to person, place, and time.  Skin: Skin is warm and dry. She is not diaphoretic.  Psychiatric: She has a normal mood and affect. Her behavior is normal. Judgment and thought content normal.  Nursing note and vitals reviewed.    UC Treatments / Results  Labs (all labs ordered are listed, but only abnormal results are displayed) Labs Reviewed - No data to display  EKG  EKG Interpretation None       Radiology Dg Shoulder Right  Result Date: 04/12/2017 CLINICAL DATA:  Pt c/o right shoulder pain x 2 weeks. No known injury. Pt is a caregiver for an infant. Pain progressively worse throughout the day. EXAM: RIGHT SHOULDER - 2+ VIEW COMPARISON:  None. FINDINGS: There is no evidence of fracture or dislocation. There is no evidence of arthropathy or  other focal bone abnormality. Soft tissues are unremarkable. IMPRESSION: Negative. Electronically Signed   By: Franki Cabot M.D.   On: 04/12/2017 11:14    Procedures Procedures (including critical care time)  Medications Ordered in UC Medications - No data to display   Initial Impression / Assessment and Plan / UC Course  I have reviewed the triage vital signs and the nursing notes.  Pertinent labs & imaging results that  were available during my care of the patient were reviewed by me and considered in my medical decision making (see chart for details).     Plan: 1. Test/x-ray results and diagnosis reviewed with patient 2. rx as per orders; risks, benefits, potential side effects reviewed with patient 3. Recommend supportive treatment with avoidance of symptoms and rest. PerForm pendulum exercises as demonstrated 3-4 times daily to prevent frozen shoulder . If not improving in 2-3 weeks follow-up with her primary care physician or orthopedic surgeon. 4. F/u prn if symptoms worsen or don't improve   Final Clinical Impressions(s) / UC Diagnoses   Final diagnoses:  Impingement syndrome of right shoulder    ED Discharge Orders        Ordered    naproxen (NAPROSYN) 500 MG tablet  2 times daily with meals     04/12/17 1131       Controlled Substance Prescriptions [place] Controlled Substance Registry consulted? Not Applicable   Lorin Picket, PA-C 04/12/17 1139

## 2017-04-12 NOTE — ED Triage Notes (Signed)
Patient c/o right arm pain for the past 2 weeks.  Patient reports pain worsen with movement.  Patient denies fall or injury.

## 2017-04-12 NOTE — Discharge Instructions (Signed)
Performing pendulum exercises as demonstrated. Do these 3-4 times daily for 1-2 minutes each time.

## 2018-03-31 IMAGING — CR DG SHOULDER 2+V*R*
3 series · 3 of 3 positions shown · non-contrast
Comparison: None.

CLINICAL DATA: Pt c/o right shoulder pain x 2 weeks. No known
injury. Pt is a caregiver for an infant. Pain progressively worse
throughout the day.

EXAM:
RIGHT SHOULDER - 2+ VIEW

[shoulder grashey]
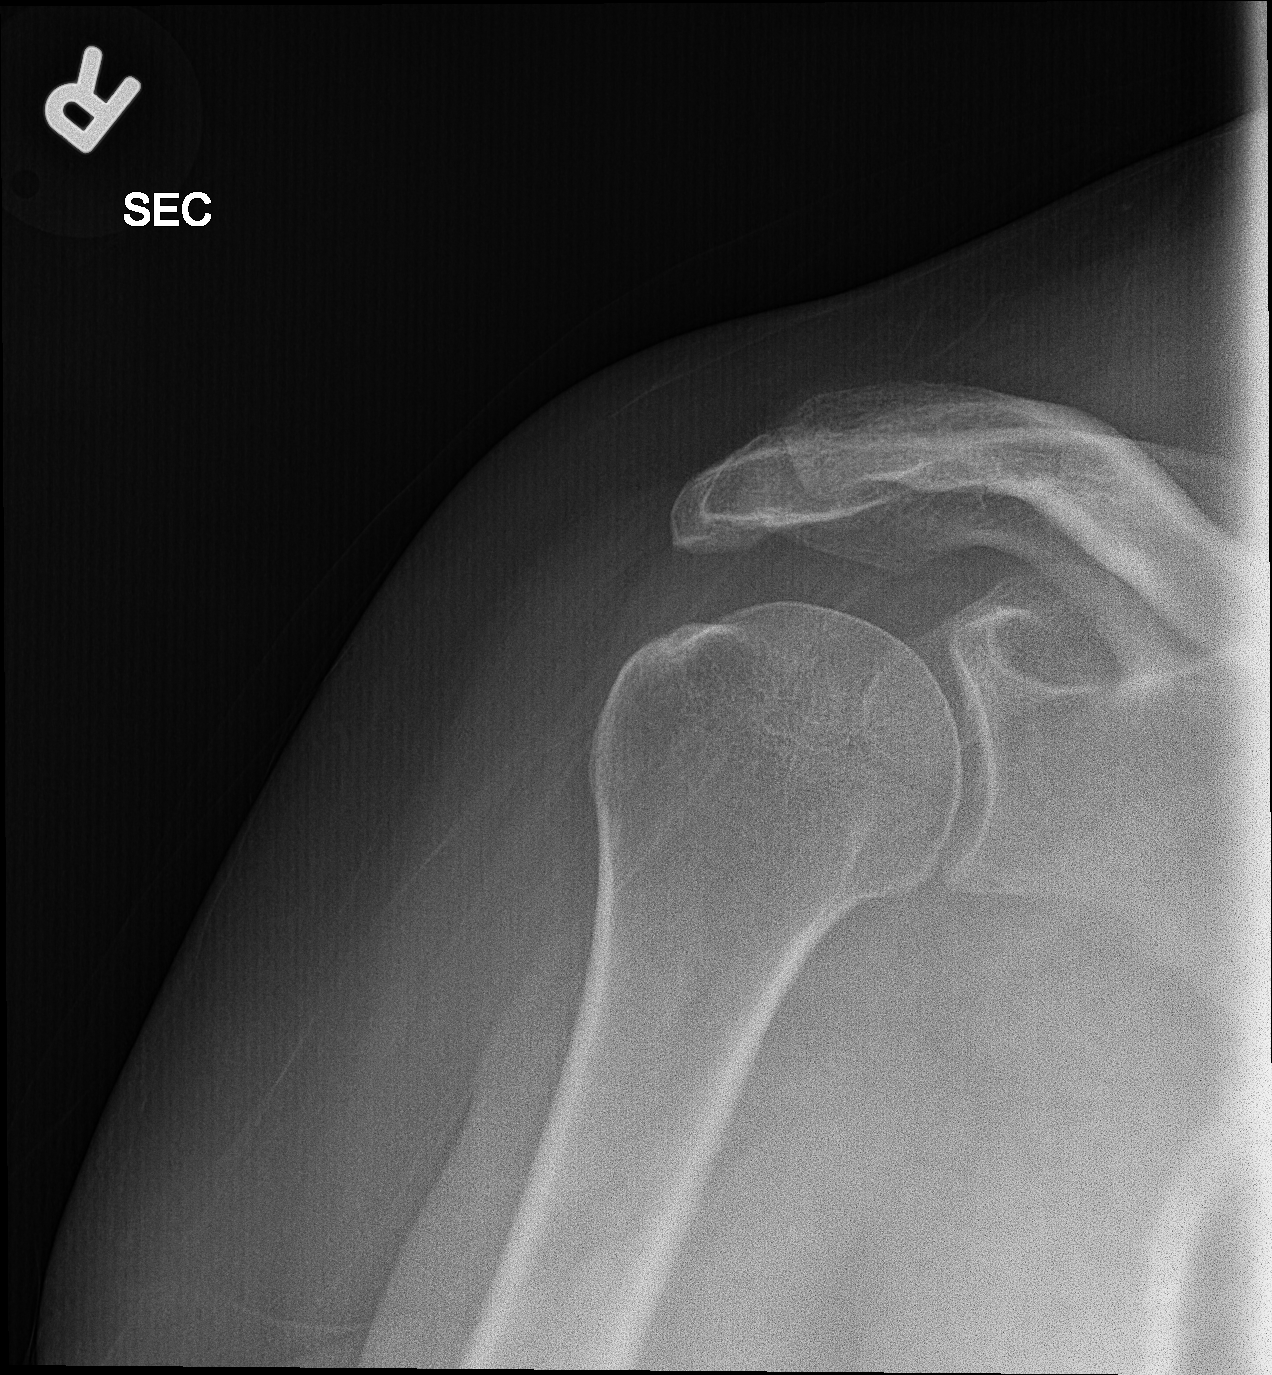

[shoulder y view]
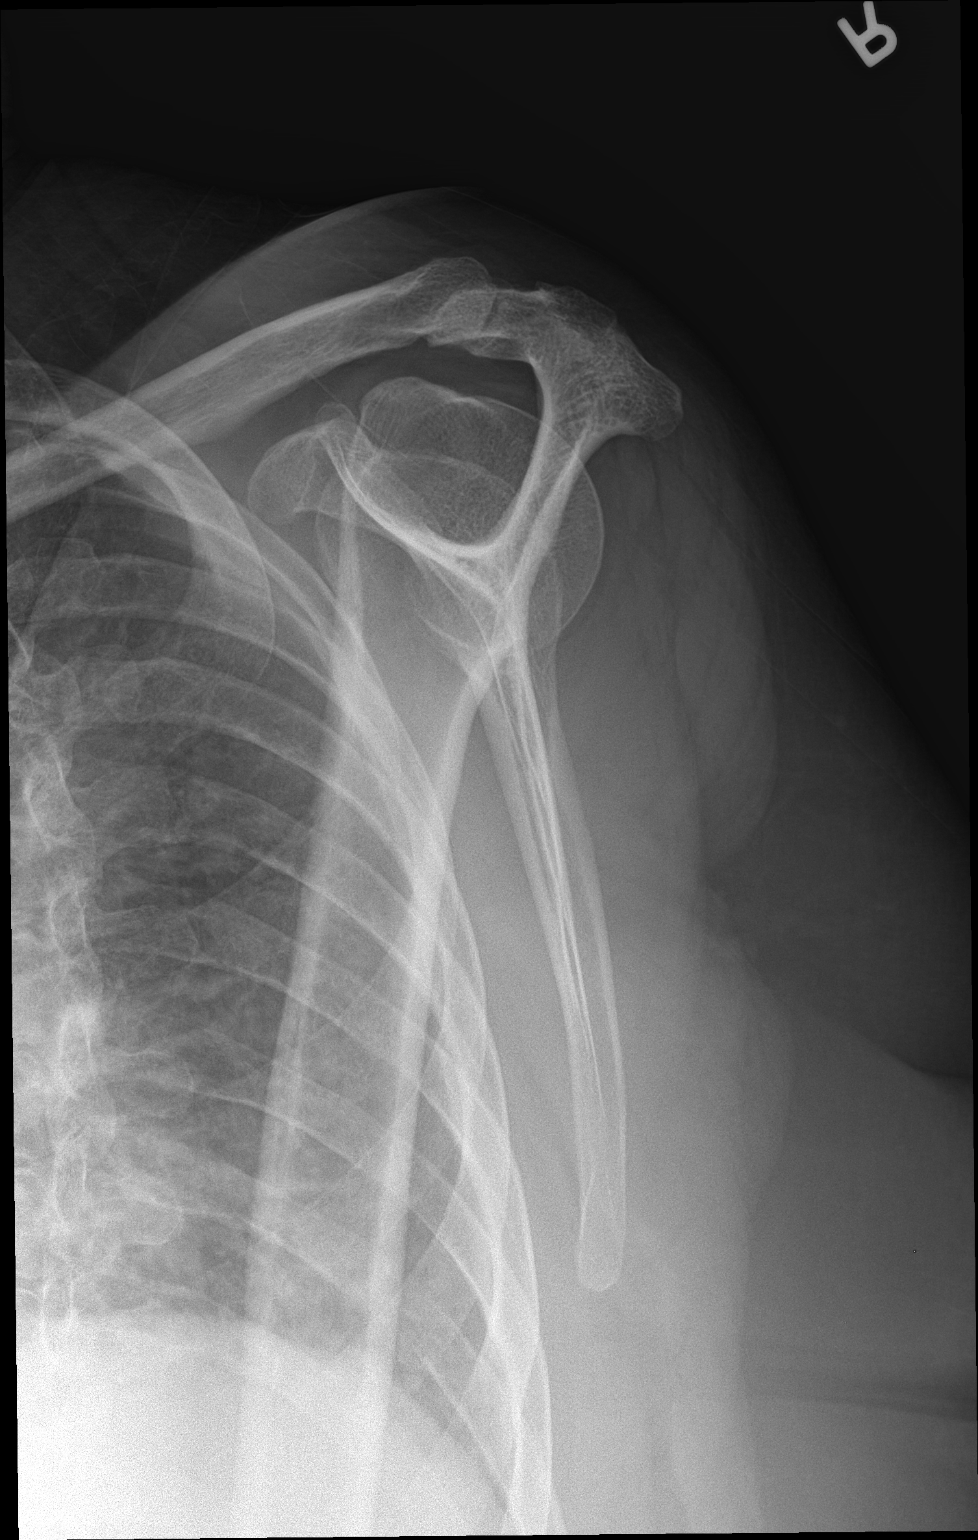

[shoulder axial]
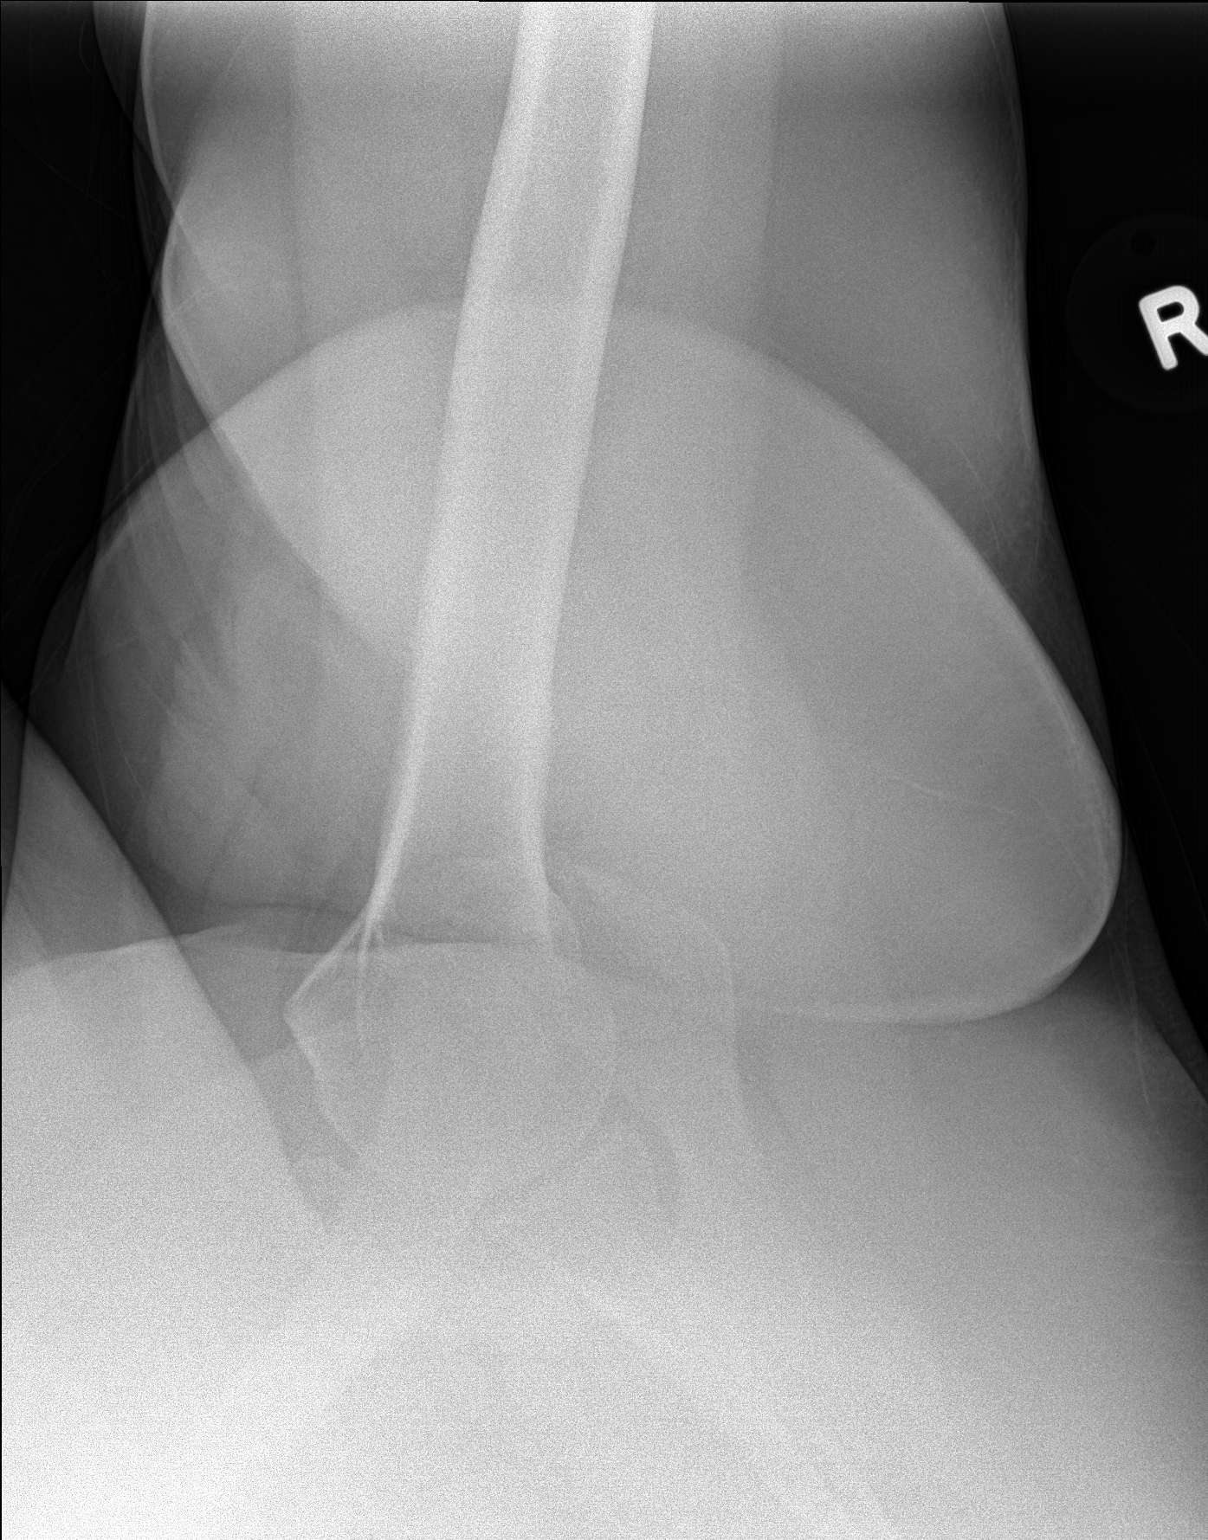

[3 of 3 positions shown; findings below may reference images not displayed]

FINDINGS: There is no evidence of fracture or dislocation. There is no
evidence of arthropathy or other focal bone abnormality. Soft
tissues are unremarkable.
IMPRESSION: Negative.

## 2020-12-01 ENCOUNTER — Other Ambulatory Visit: Payer: Self-pay

## 2020-12-01 ENCOUNTER — Ambulatory Visit
Admission: EM | Admit: 2020-12-01 | Discharge: 2020-12-01 | Disposition: A | Discharge status: Home / Self Care | Payer: Managed Care, Other (non HMO)

## 2021-06-18 ENCOUNTER — Other Ambulatory Visit: Payer: Managed Care, Other (non HMO)

## 2022-05-04 ENCOUNTER — Encounter: Payer: Self-pay | Admitting: Emergency Medicine

## 2022-05-04 ENCOUNTER — Ambulatory Visit
Admission: EM | Admit: 2022-05-04 | Discharge: 2022-05-04 | Disposition: A | Discharge status: Home / Self Care | Payer: Managed Care, Other (non HMO)

## 2022-05-04 DIAGNOSIS — R051 Acute cough: Secondary | ICD-10-CM | POA: Diagnosis not present

## 2022-05-04 DIAGNOSIS — R0982 Postnasal drip: Secondary | ICD-10-CM

## 2022-05-04 DIAGNOSIS — J019 Acute sinusitis, unspecified: Secondary | ICD-10-CM

## 2022-05-04 MED ORDER — LIDOCAINE VISCOUS HCL 2 % MT SOLN: 15.0000 mL | OROMUCOSAL | 0 refills | Status: DC | PRN

## 2022-05-04 MED ORDER — PROMETHAZINE-DM 6.25-15 MG/5ML PO SYRP: 5.0000 mL | ORAL_SOLUTION | Freq: Four times a day (QID) | ORAL | 0 refills | Status: DC | PRN

## 2022-05-04 MED ORDER — AMOXICILLIN-POT CLAVULANATE 875-125 MG PO TABS: 1.0000 | ORAL_TABLET | Freq: Two times a day (BID) | ORAL | 0 refills | Status: AC

## 2022-05-04 NOTE — ED Triage Notes (Signed)
Patient c/o  cough and chest congestion for the past 2 weeks.  Patient denies fevers.

## 2022-05-04 NOTE — Discharge Instructions (Addendum)
-  I believe you have developed a sinus infection so I sent antibiotics to the pharmacy as well as cough medication and lidocaine for your throat.  Drink plenty of fluids and rest.  You should be feeling better over the next few days and your symptoms should be mostly cleared up in the next week.  If you develop a fever or have any chest pain or shortness of breath you should be seen again and reevaluated.

## 2022-05-04 NOTE — ED Provider Notes (Signed)
[facility]    CSN: [id number] Arrival date & time: 05/04/22  6073      History   Chief Complaint Chief Complaint  Patient presents with   Cough    HPI Bianca Wood is a 61 y.o. female presenting for 2-week history of cough and nasal congestion as well as sinus pressure.  She reports she feels some congestion in her chest as well.  She says over the past couple of days she started to develop a lot of discomfort in her throat and increased postnasal drainage.  Denies any fever, ear pain, shortness of breath.  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  She has used over-the-counter throat drops but no other medications because she says she was not sure what to take.  She is concerned about potentially worsening infection at this time.  Her medical history significant for hypertension and hyperlipidemia.  No history of lung disease.  No other complaints.  HPI  Past Medical History:  Diagnosis Date   GERD (gastroesophageal reflux disease)    Hypercholesteremia    Hypertension     Patient Active Problem List   Diagnosis Date Noted   Gastro-esophageal reflux disease without esophagitis 03/26/2015   Family history of colon cancer 03/26/2015   Fibroids, intramural 03/18/2015   Disease of thyroid gland 03/18/2015   Avitaminosis D 10/21/2014   Clinical depression 09/08/2014   Essential (primary) hypertension 09/08/2014   Breast lump 09/08/2014   Arthritis 09/06/2014   Seizure ([place]) 09/06/2014   Absolute anemia 11/23/2013   Chest pain 12/24/2012    Past Surgical History:  Procedure Laterality Date   BREAST BIOPSY     COLONOSCOPY  2015   normal   TUBAL LIGATION      OB History   No obstetric history on file.      Home Medications    Prior to Admission medications   Medication Sig Start Date End Date Taking? Authorizing Provider  amLODipine (NORVASC) 10 MG tablet Take 10 mg by mouth daily.   Yes  [provider]  amLODipine-benazepril (LOTREL) 10-20 MG capsule Take 1 capsule by mouth daily.   Yes [provider]  amoxicillin-clavulanate (AUGMENTIN) 875-125 MG tablet Take 1 tablet by mouth every 12 (twelve) hours for 7 days. 05/04/22 05/11/22 Yes Laurene Footman B, PA-C  atorvastatin (LIPITOR) 10 MG tablet Take 10 mg by mouth daily.   Yes [provider]  hydrochlorothiazide (HYDRODIURIL) 25 MG tablet Take 25 mg by mouth daily.   Yes [provider]  lidocaine (XYLOCAINE) 2 % solution Use as directed 15 mLs in the mouth or throat every 3 (three) hours as needed for mouth pain (swish and spit). 05/04/22  Yes Laurene Footman B, PA-C  losartan (COZAAR) 100 MG tablet Take 100 mg by mouth daily.   Yes [provider]  promethazine-dextromethorphan (PROMETHAZINE-DM) 6.25-15 MG/5ML syrup Take 5 mLs by mouth 4 (four) times daily as needed. 05/04/22  Yes Laurene Footman B, PA-C  cyclobenzaprine (FLEXERIL) 10 MG tablet Take 1 tablet (10 mg total) by mouth every 8 (eight) hours as needed for muscle spasms. 11/05/15   Beers, Pierce Crane, PA-C  ibuprofen (ADVIL,MOTRIN) 800 MG tablet Take 1 tablet (800 mg total) by mouth every 8 (eight) hours as needed. 11/05/15   Beers, Pierce Crane, PA-C  KLOR-CON M10 10 MEQ tablet Take 10 mEq by mouth daily.    [provider]  naproxen (NAPROSYN)  500 MG tablet Take 1 tablet (500 mg total) 2 (two) times daily with a meal by mouth. 04/12/17   Lorin Picket, PA-C    Family History Family History  Problem Relation Age of Onset   Diabetes Mother    CAD Mother    Colon cancer Sister    Cancer Father     Social History Social History   Tobacco Use   Smoking status: Former   Smokeless tobacco: Never  Scientific laboratory technician Use: Never used  Substance Use Topics   Alcohol use: No    Alcohol/week: 0.0 standard drinks of alcohol   Drug use: No     Allergies   Patient has no known allergies.   Review of  Systems Review of Systems  Constitutional:  Negative for chills, diaphoresis, fatigue and fever.  HENT:  Positive for congestion, postnasal drip, rhinorrhea, sinus pressure and sore throat. Negative for ear pain.   Respiratory:  Positive for cough. Negative for shortness of breath.   Cardiovascular:  Negative for chest pain.  Gastrointestinal:  Negative for abdominal pain, nausea and vomiting.  Musculoskeletal:  Negative for arthralgias and myalgias.  Skin:  Negative for rash.  Neurological:  Negative for weakness and headaches.  Hematological:  Negative for adenopathy.     Physical Exam Triage Vital Signs ED Triage Vitals  Enc Vitals Group     BP      Pulse      Resp      Temp      Temp src      SpO2      Weight      Height      Head Circumference      Peak Flow      Pain Score      Pain Loc      Pain Edu?      Excl. in [place]?    No data found.  Updated Vital Signs BP (!) 141/79 (BP Location: Right Arm)   Pulse 87   Temp 98.1 F (36.7 C) (Oral)   Resp 14   Ht 5' 6.5" (1.689 m)   Wt 240 lb (108.9 kg)   SpO2 97%   BMI 38.16 kg/m   Physical Exam Vitals and nursing note reviewed.  Constitutional:      General: She is not in acute distress.    Appearance: Normal appearance. She is not ill-appearing or toxic-appearing.  HENT:     Head: Normocephalic and atraumatic.     Nose: Congestion present.     Mouth/Throat:     Mouth: Mucous membranes are moist.     Pharynx: Oropharynx is clear. Posterior oropharyngeal erythema (mild with PND which is slight yellow) present.  Eyes:     General: No scleral icterus.       Right eye: No discharge.        Left eye: No discharge.     Conjunctiva/sclera: Conjunctivae normal.  Cardiovascular:     Rate and Rhythm: Normal rate and regular rhythm.     Heart sounds: Normal heart sounds.  Pulmonary:     Effort: Pulmonary effort is normal. No respiratory distress.     Breath sounds: Normal breath sounds.  Musculoskeletal:      Cervical back: Neck supple.  Skin:    General: Skin is dry.  Neurological:     General: No focal deficit present.     Mental Status: She is alert. Mental status is at baseline.  Motor: No weakness.     Gait: Gait normal.  Psychiatric:        Mood and Affect: Mood normal.        Behavior: Behavior normal.        Thought Content: Thought content normal.      UC Treatments / Results  Labs (all labs ordered are listed, but only abnormal results are displayed) Labs Reviewed - No data to display  EKG   Radiology No results found.  Procedures Procedures (including critical care time)  Medications Ordered in UC Medications - No data to display  Initial Impression / Assessment and Plan / UC Course  I have reviewed the triage vital signs and the nursing notes.  Pertinent labs & imaging results that were available during my care of the patient were reviewed by me and considered in my medical decision making (see chart for details).   61 year old female presents for cough and congestion x 2 weeks with recent worsening of postnasal drainage and sore throat.  Also reports some sinus pressure.  Denies fever, chest pain or breathing difficulty.  Vitals are stable.  She is overall well-appearing in no acute distress.  Exam she does have nasal congestion without drainage.  Throat is erythematous mildly with slight yellowish postnasal drainage.  Chest clear to auscultation and heart regular rate and rhythm.  Suspect acute sinusitis given recent worsening of symptoms.  Will treat at this time with Augmentin.  Also sent Promethazine DM and viscous lidocaine.  Encourage plenty rest and fluids.  Advised to return if any worsening symptoms especially if she develops a fever or has worsening cough or shortness of breath.  Work note given.   Final Clinical Impressions(s) / UC Diagnoses   Final diagnoses:  Acute sinusitis, recurrence not specified, unspecified location  Post-nasal drainage   Acute cough     Discharge Instructions      -I believe you have developed a sinus infection so I sent antibiotics to the pharmacy as well as cough medication and lidocaine for your throat.  Drink plenty of fluids and rest.  You should be feeling better over the next few days and your symptoms should be mostly cleared up in the next week.  If you develop a fever or have any chest pain or shortness of breath you should be seen again and reevaluated.     ED Prescriptions     Medication Sig Dispense Auth. Provider   amoxicillin-clavulanate (AUGMENTIN) 875-125 MG tablet Take 1 tablet by mouth every 12 (twelve) hours for 7 days. 14 tablet Danton Clap, PA-C   promethazine-dextromethorphan (PROMETHAZINE-DM) 6.25-15 MG/5ML syrup Take 5 mLs by mouth 4 (four) times daily as needed. 118 mL Laurene Footman B, PA-C   lidocaine (XYLOCAINE) 2 % solution Use as directed 15 mLs in the mouth or throat every 3 (three) hours as needed for mouth pain (swish and spit). 100 mL Danton Clap, PA-C      PDMP not reviewed this encounter.   Danton Clap, PA-C 05/04/22 [phone]

## 2023-10-20 ENCOUNTER — Ambulatory Visit: Admission: EM | Admit: 2023-10-20 | Discharge: 2023-10-20 | Disposition: A | Discharge status: Home / Self Care

## 2023-10-20 DIAGNOSIS — W57XXXA Bitten or stung by nonvenomous insect and other nonvenomous arthropods, initial encounter: Secondary | ICD-10-CM | POA: Diagnosis not present

## 2023-10-20 DIAGNOSIS — S80261A Insect bite (nonvenomous), right knee, initial encounter: Secondary | ICD-10-CM | POA: Diagnosis not present

## 2023-10-20 DIAGNOSIS — T148XXA Other injury of unspecified body region, initial encounter: Secondary | ICD-10-CM

## 2023-10-20 MED ORDER — DOXYCYCLINE HYCLATE 100 MG PO CAPS: 200.0000 mg | ORAL_CAPSULE | Freq: Once | ORAL | 0 refills | Status: AC

## 2023-10-20 NOTE — ED Provider Notes (Signed)
 [facility]    CSN: [id number] Arrival date & time: 10/20/23  0857      History   Chief Complaint Chief Complaint  Patient presents with   Insect Bite    HPI Bianca Wood is a 63 y.o. female.   HPI  63 year old female with past medical history significant for hypertension, high cholesterol, GERD, seizures, vitamin D deficiency, and thyroid  disease presents for evaluation of a tick bite she sustained to the back of her right knee.  She is unsure of how long the tick was in place.  She is not currently experiencing any headache, body aches, or fevers.  Her husband tried to remove the tick but the gel parts remain in the skin.  Past Medical History:  Diagnosis Date   GERD (gastroesophageal reflux disease)    Hypercholesteremia    Hypertension     Patient Active Problem List   Diagnosis Date Noted   Gastro-esophageal reflux disease without esophagitis 03/26/2015   Family history of colon cancer 03/26/2015   Fibroids, intramural 03/18/2015   Disease of thyroid  gland 03/18/2015   Avitaminosis D 10/21/2014   Clinical depression 09/08/2014   Essential (primary) hypertension 09/08/2014   Breast lump 09/08/2014   Arthritis 09/06/2014   Seizure ([facility]) 09/06/2014   Absolute anemia 11/23/2013   Chest pain 12/24/2012    Past Surgical History:  Procedure Laterality Date   BREAST BIOPSY     COLONOSCOPY  2015   normal   TUBAL LIGATION      OB History   No obstetric history on file.      Home Medications    Prior to Admission medications   Medication Sig Start Date End Date Taking? Authorizing Provider  amLODipine (NORVASC) 10 MG tablet Take 10 mg by mouth daily.   Yes [provider]  amLODipine-benazepril (LOTREL) 10-20 MG capsule Take 1 capsule by mouth daily.   Yes [provider]  atorvastatin (LIPITOR) 10 MG tablet Take 10 mg by mouth daily.   Yes [provider]  doxycycline (VIBRAMYCIN) 100 MG capsule Take 2 capsules  (200 mg total) by mouth once for 1 dose. 10/20/23 10/20/23 Yes Kent Pear, NP  ELIQUIS 5 MG TABS tablet Take 5 mg by mouth 2 (two) times daily.   Yes [provider]  hydrochlorothiazide (HYDRODIURIL) 25 MG tablet Take 25 mg by mouth daily.   Yes [provider]  KLOR-CON M10 10 MEQ tablet Take 10 mEq by mouth daily.   Yes [provider]  losartan (COZAAR) 100 MG tablet Take 100 mg by mouth daily.   Yes [provider]  pantoprazole (PROTONIX) 40 MG tablet Take 40 mg by mouth daily.   Yes [provider]    Family History Family History  Problem Relation Age of Onset   Diabetes Mother    CAD Mother    Colon cancer Sister    Cancer Father     Social History Social History   Tobacco Use   Smoking status: Former   Smokeless tobacco: Never  Advertising account planner   Vaping status: Never Used  Substance Use Topics   Alcohol use: No    Alcohol/week: 0.0 standard drinks of alcohol   Drug use: No     Allergies   Patient has no known allergies.   Review of Systems Review of Systems  Constitutional:  Negative for fever.  Skin:  Positive for wound.     Physical Exam Triage Vital Signs ED Triage Vitals  Encounter  Vitals Group     BP      Systolic BP Percentile      Diastolic BP Percentile      Pulse      Resp      Temp      Temp src      SpO2      Weight      Height      Head Circumference      Peak Flow      Pain Score      Pain Loc      Pain Education      Exclude from Growth Chart    No data found.  Updated Vital Signs BP (!) 155/88 (BP Location: Left Arm)   Pulse 83   Temp 98.8 F (37.1 C) (Oral)   Resp 16   Ht 5\' 6"  (1.676 m)   Wt 220 lb (99.8 kg)   SpO2 98%   BMI 35.51 kg/m   Visual Acuity Right Eye Distance:   Left Eye Distance:   Bilateral Distance:    Right Eye Near:   Left Eye Near:    Bilateral Near:     Physical Exam Vitals and nursing note reviewed.  Constitutional:      Appearance: Normal  appearance. She is not ill-appearing.  HENT:     Head: Normocephalic and atraumatic.  Skin:    General: Skin is warm and dry.     Capillary Refill: Capillary refill takes less than 2 seconds.     Findings: No erythema or rash.  Neurological:     General: No focal deficit present.     Mental Status: She is alert and oriented to person, place, and time.      UC Treatments / Results  Labs (all labs ordered are listed, but only abnormal results are displayed) Labs Reviewed - No data to display  EKG   Radiology No results found.  Procedures Procedures (including critical care time)  Medications Ordered in UC Medications - No data to display  Initial Impression / Assessment and Plan / UC Course  I have reviewed the triage vital signs and the nursing notes.  Pertinent labs & imaging results that were available during my care of the patient were reviewed by me and considered in my medical decision making (see chart for details).   Patient is a nontoxic-appearing 63 year old female presenting with request to have the embedded jaw parts of a tick removed from the back of her right knee.  As you can see in image above, there is no surrounding erythema to the envenomation site.  There is a hard, dark object which appears to be the head of a tick remaining embedded in the skin.  I cleansed the area with alcohol, numbed the area with topical anesthetic spray, and removed the gel parts in their entirety with the bevel of an 18-gauge hypodermic needle.  I again cleansed the area with alcohol and applied a Band-Aid.  Given that we are unsure of what type of tick bit the patient, or how long it was attached, I will discharge her home on Lyme prophylaxis with a single 200 mg dose of doxycycline.   Final Clinical Impressions(s) / UC Diagnoses   Final diagnoses:  Foreign body in skin  Tick bite of right knee, initial encounter     Discharge Instructions      Take the single 200 mg dose  of doxycycline with food for prevention of Lyme disease.  Continue to  monitor the bite area for any developing redness, swelling, heat, pain, pus drainage, or red streaks going up your leg.  Also for any rashes or fevers.  If any of the above conditions arise you need to return for reevaluation or follow-up with your primary care provider.   ED Prescriptions     Medication Sig Dispense Auth. Provider   doxycycline (VIBRAMYCIN) 100 MG capsule Take 2 capsules (200 mg total) by mouth once for 1 dose. 2 capsule Kent Pear, NP      PDMP not reviewed this encounter.   Kent Pear, NP 10/20/23 [phone]

## 2023-10-20 NOTE — Discharge Instructions (Signed)
 Take the single 200 mg dose of doxycycline with food for prevention of Lyme disease.  Continue to monitor the bite area for any developing redness, swelling, heat, pain, pus drainage, or red streaks going up your leg.  Also for any rashes or fevers.  If any of the above conditions arise you need to return for reevaluation or follow-up with your primary care provider.

## 2023-10-20 NOTE — ED Triage Notes (Signed)
 Pt c/o tick bite behind R knee that she noticed this AM. States tried to remove it but head still lodged in knee.

## 2023-12-27 NOTE — Progress Notes (Signed)
 Bianca Wood is a 63 y.o. female with a PMHx as stated below that presents to clinic today regarding the following issues:   To address at next visit: [Blood pressure log, check machine, get repeat BMP ]    Assessment & Plan Dizziness Experiencing dizziness that started about 2.5 weeks ago when she was sitting. Felt like her head didn't feel right, didn't alter her balance and was able to continue walking. When it happened originally she rested her head back and it made her feel better but the dizzy spells occurred off and on throughout the day. When dizziness occurs it lasts for a few minutes but will happen intermittently throughout the day. It was happening everyday but has not had any dizziness the last three days.  Neurologically in tact. No cranial nerve deficits. Negative Eply maneuver.   - Dizziness likely due to high blood pressure    Essential hypertension Patient on triple therapy and blood pressures still not in goal. Patient does have a home machine that she does not use. Patient unsure whether or not her dizziness coincides with elevations in blood pressure.  Current HTN regimen is amlodipine 10mg , hydrochlorothiazide 25mg , losartan 100mg . The patient takes her medications at 4pm everyday.  - Patient was instructed to take 1/2 of Losartan pill in the morning and 1/2 in the evening but the patient does not have a pill splitting device so it was recommended that the patient take the full 100mg  of Losartan in the morning  - Follow up with PCP on blood pressures with taking the medication in the mornings  - Repeat BMP at follow up   Orders: .  hydroCHLOROthiazide (HYDRODIURIL) 25 MG tablet; Take 1 tablet (25 mg total) by mouth daily.    Future Appointments  Date Time Provider Department Center  01/11/2024  4:20 PM Craig Arlean Rocher, MD [facility]  03/01/2024  8:30 AM [facility]  03/01/2024  9:45 AM Leigh Tinnie Asberry [facility]   03/15/2024  9:40 AM Alyse Slater Pao, MD [facility]  04/12/2024 11:45 AM Luke Jerilynn Ned, MD [facility]    Chief Complaint  Patient presents with  . Follow-up    Dizziness , heart rate was 100 and calcium levels     Attending: Lyle Waddell HERO, MD  HISTORY: I have reviewed the patients problem list, current medications, and allergies and have updated/reconciled them as needed.   Bianca Wood is a 63 y.o. female with a PMHx as stated below that presents to clinic today for the following issues:   HPI: Bianca Wood came to clinic today with complaints of 2.5 weeks of dizziness. The dizziness presented while the patient was sitting and had no provoking event. The patient did not have any accompanying nausea, vomiting or headaches accompanied with her dizziness. The patient stated that the dizziness only lasted for a few minutes each time and there was no temporality to the dizziness events. The patient was neurologically intact and had no deficits in motor ability. The patient had no provocation of symptoms with Eply maneuver to either side.   It is likely that the patient has experienced increased dizziness due to uncontrolled HTN. The patient is on triple therapy for HTN and her blood pressures are still elevated in clinic today. The patient has a home blood pressure machine and was sent home with a log to monitor daily blood pressures with a follow up in 2 weeks to check at  home log with the switch of taking 100mg  in the AM and get a repeat BMP.  The patient also expressed concern about her calcium on her most recent bloodwork. She was reassured that she has no symptoms of hypercalcemia and that she has also had multiple values around 10.0 - 11.0 over the last year. The patient is also scheduled for a partial thyroidectomy in November.  Next visit: Check BP log, BMP, BP machine and medication adherence.    Review of Systems  Constitutional: Negative.   HENT:  Negative.    Eyes: Negative.   Respiratory: Negative.    Cardiovascular: Negative.   Gastrointestinal: Negative.   Genitourinary: Negative.   Musculoskeletal: Negative.   Skin: Negative.   Neurological: Negative.        Not currently dizzy and no dizziness on exam today   Endo/Heme/Allergies: Negative.   Psychiatric/Behavioral: Negative.      Social History[1]  Objective  Active Ambulatory Problems    Diagnosis Date Noted  . Intramural leiomyoma of uterus 09/06/2014  . Essential hypertension 09/08/2014  . Vitamin D deficiency 10/21/2014  . Gastroesophageal reflux disease without esophagitis 03/26/2015  . Morbid (severe) obesity due to excess calories (CMS-HCC)   . Chronic left shoulder pain 02/15/2021  . Bruising 02/15/2021  . Encounter for screening mammogram for malignant neoplasm of breast 02/15/2021  . Benign nevus 02/15/2021  . Multiple thyroid  nodules 04/04/2023  . Malignant carcinoid tumor of lung    07/21/2023  . Nontoxic multinodular goiter 12/22/2023   Resolved Ambulatory Problems    Diagnosis Date Noted  . Anemia 11/23/2013  . Arthritis 09/06/2014  . Chest pain 12/24/2012  . Seizure    09/06/2014  . Depression 09/08/2014  . Disease of thyroid  gland 03/18/2015  . Mass of breast 09/08/2014  . Weight loss 06/22/2015  . Urethral disorder 08/18/2017  . Abnormal colonoscopy adeoma rpt 2021 01/10/2019  . Abscess of left lung with pneumonia    04/04/2023  . Lipoma 04/05/2019   Past Medical History:  Diagnosis Date  . Breast mass in female   . Graves disease   . Hypercholesterolemia   . Hypertension     BP 154/99 (BP Site: L Arm, BP Position: Sitting) Comment: ag bp  Pulse 82   Temp 36.2 C (97.2 F) (Temporal)   Ht 166.5 cm (5' 5.55)   Wt (!) 114.1 kg (251 lb 9.6 oz)   SpO2 98%   BMI 41.17 kg/m   Physical Exam Constitutional:      Appearance: Normal appearance. She is normal weight.  HENT:     Head: Normocephalic and atraumatic.     Right Ear:  External ear normal.     Left Ear: External ear normal.     Nose: Nose normal.     Mouth/Throat:     Mouth: Mucous membranes are moist.     Pharynx: Oropharynx is clear.  Eyes:     General: No visual field deficit.    Extraocular Movements: Extraocular movements intact.     Conjunctiva/sclera: Conjunctivae normal.     Pupils: Pupils are equal, round, and reactive to light.  Cardiovascular:     Rate and Rhythm: Normal rate and regular rhythm.     Pulses: Normal pulses.     Heart sounds: Normal heart sounds.  Pulmonary:     Effort: Pulmonary effort is normal.     Breath sounds: Normal breath sounds.     Comments: Decreased breath sounds on the left lower due to left lower  lobe lobectomy  Abdominal:     General: Abdomen is flat. Bowel sounds are normal.     Palpations: Abdomen is soft.  Musculoskeletal:        General: Normal range of motion.     Cervical back: Normal range of motion and neck supple.  Skin:    General: Skin is warm.  Neurological:     General: No focal deficit present.     Mental Status: She is alert and oriented to person, place, and time.     Cranial Nerves: Cranial nerves 2-12 are intact. No cranial nerve deficit or facial asymmetry.     Sensory: Sensation is intact. No sensory deficit.     Motor: Motor function is intact. No weakness or pronator drift.     Coordination: Coordination is intact. Coordination normal. Finger-Nose-Finger Test normal.     Gait: Gait is intact. Gait normal.     Comments: Negative Eply   Psychiatric:        Mood and Affect: Mood normal.        Behavior: Behavior normal.        Thought Content: Thought content normal.        Judgment: Judgment normal.    Office Visit on 12/22/2023  Component Date Value  . WBC 12/22/2023 5.2   . RBC 12/22/2023 4.55   . HGB 12/22/2023 14.0   . HCT 12/22/2023 39.4   . MCV 12/22/2023 86.5   . MCH 12/22/2023 30.8   . MCHC 12/22/2023 35.7   . RDW 12/22/2023 14.4   . MPV 12/22/2023 7.7   .  Platelet 12/22/2023 268   . TSH 12/22/2023 1.281   . Free T4 12/22/2023 1.28   . Sodium 12/22/2023 141   . Potassium 12/22/2023 3.9   . Chloride 12/22/2023 99   . CO2 12/22/2023 28.9   . Anion Gap 12/22/2023 13   . BUN 12/22/2023 15   . Creatinine 12/22/2023 0.79   . BUN/Creatinine Ratio 12/22/2023 19   . eGFR CKD-EPI (2021) Fema* 12/22/2023 83   . Glucose 12/22/2023 102   . Calcium 12/22/2023 10.7 (H)     Marry Louder, DO [facility] Family Medicine PGY-1  [facility]  at [facility] [address] [place], [place] [address] . Telephone [phone] . Fax [phone] [email or web address]        [1] Social History Socioeconomic History  . Marital status: Married    Spouse name: Sandra  . Number of children: 3  . Years of education: None  . Highest education level: None  Tobacco Use  . Smoking status: Former    Current packs/day: 0.00    Average packs/day: 0.5 packs/day for 10.0 years (5.0 ttl pk-yrs)    Types: Cigarettes    Start date: 05/26/1973    Quit date: 05/27/1983    Years since quitting: 40.6  . Smokeless tobacco: Never  Vaping Use  . Vaping status: Never Used  Substance and Sexual Activity  . Alcohol use: No    Comment: used to use 30 years ago   . Drug use: No  . Sexual activity: Yes    Partners: Male  Social History Narrative   The patient is married.  She lives at home with her husband   Social Drivers of Corporate investment banker Strain: Low Risk  (08/16/2023)   Overall Financial Resource Strain (CARDIA)   . Difficulty of Paying Living Expenses: Not very hard  Food Insecurity: No  Food Insecurity (08/16/2023)   Hunger Vital Sign   . Worried About Programme researcher, broadcasting/film/video in the Last Year: Never true   . Ran Out of Food in the Last Year: Never true  Transportation Needs: No Transportation Needs (08/16/2023)   PRAPARE - Transportation   . Lack of Transportation (Medical): No   . Lack of  Transportation (Non-Medical): No  Social Connections: Unknown (12/29/2018)   Social Connection and Isolation Panel   . Marital Status: Married  Housing: Low Risk  (08/16/2023)   Housing   . Within the past 12 months, have you ever stayed: outside, in a car, in a tent, in an overnight shelter, or temporarily in someone else's home (i.e. couch-surfing)?: No   . Are you worried about losing your housing?: No

## 2023-12-28 ENCOUNTER — Other Ambulatory Visit: Payer: Self-pay

## 2023-12-28 ENCOUNTER — Encounter: Payer: Self-pay | Admitting: *Deleted

## 2023-12-28 ENCOUNTER — Emergency Department

## 2023-12-28 ENCOUNTER — Emergency Department
Admission: EM | Admit: 2023-12-28 | Discharge: 2023-12-29 | Disposition: A | Discharge status: Home / Self Care | Attending: Emergency Medicine | Admitting: Emergency Medicine

## 2023-12-28 DIAGNOSIS — Y92410 Unspecified street and highway as the place of occurrence of the external cause: Secondary | ICD-10-CM | POA: Insufficient documentation

## 2023-12-28 DIAGNOSIS — S20211A Contusion of right front wall of thorax, initial encounter: Secondary | ICD-10-CM | POA: Diagnosis not present

## 2023-12-28 DIAGNOSIS — E278 Other specified disorders of adrenal gland: Secondary | ICD-10-CM | POA: Diagnosis not present

## 2023-12-28 DIAGNOSIS — E042 Nontoxic multinodular goiter: Secondary | ICD-10-CM | POA: Insufficient documentation

## 2023-12-28 DIAGNOSIS — S3993XA Unspecified injury of pelvis, initial encounter: Secondary | ICD-10-CM | POA: Diagnosis present

## 2023-12-28 DIAGNOSIS — S20219A Contusion of unspecified front wall of thorax, initial encounter: Secondary | ICD-10-CM

## 2023-12-28 DIAGNOSIS — R918 Other nonspecific abnormal finding of lung field: Secondary | ICD-10-CM | POA: Insufficient documentation

## 2023-12-28 DIAGNOSIS — I10 Essential (primary) hypertension: Secondary | ICD-10-CM | POA: Insufficient documentation

## 2023-12-28 DIAGNOSIS — R079 Chest pain, unspecified: Secondary | ICD-10-CM | POA: Diagnosis present

## 2023-12-28 DIAGNOSIS — S299XXA Unspecified injury of thorax, initial encounter: Secondary | ICD-10-CM | POA: Diagnosis present

## 2023-12-28 LAB — BASIC METABOLIC PANEL WITH GFR
Anion gap: 9 (ref 5–15)
BUN: 19 mg/dL (ref 8–23)
CO2: 26 mmol/L (ref 22–32)
Calcium: 10.3 mg/dL (ref 8.9–10.3)
Chloride: 104 mmol/L (ref 98–111)
Creatinine, Ser: 0.91 mg/dL (ref 0.44–1.00)
GFR, Estimated: >60 mL/min (ref >60)
Glucose, Bld: 127 mg/dL — ABNORMAL HIGH (ref 70–99)
Potassium: 3.8 mmol/L (ref 3.5–5.1)
Sodium: 139 mmol/L (ref 135–145)

## 2023-12-28 LAB — CBC
HCT: 41.4 % (ref 36.0–46.0)
Hemoglobin: 13.6 g/dL (ref 12.0–15.0)
MCH: 26.3 pg (ref 26.0–34.0)
MCHC: 32.9 g/dL (ref 30.0–36.0)
MCV: 80.1 fL (ref 80.0–100.0)
Platelets: 287 K/uL (ref 150–400)
RBC: 5.17 MIL/uL — ABNORMAL HIGH (ref 3.87–5.11)
RDW: 15 % (ref 11.5–15.5)
WBC: 5.6 K/uL (ref 4.0–10.5)
nRBC: 0 % (ref 0.0–0.2)

## 2023-12-28 LAB — TROPONIN I (HIGH SENSITIVITY)
Troponin I (High Sensitivity): <2 ng/L (ref <18)
Troponin I (High Sensitivity): <2 ng/L (ref <18)

## 2023-12-28 MED ORDER — IOHEXOL 300 MG/ML  SOLN
100.0000 mL | Freq: Once | INTRAMUSCULAR | Status: AC | PRN
  Administered 2023-12-29: 100 mL via INTRAVENOUS

## 2023-12-28 MED ORDER — HYDROCODONE-ACETAMINOPHEN 5-325 MG PO TABS
1.0000 | ORAL_TABLET | Freq: Once | ORAL | Status: AC
  Administered 2023-12-28: 1 via ORAL
  Filled 2023-12-28: qty 1

## 2023-12-28 NOTE — Progress Notes (Signed)
 I saw and evaluated the patient, participating in the key portions of the service.  I reviewed the resident???s note.  I agree with the resident???s findings and plan. Ara Kussmaul, MD

## 2023-12-28 NOTE — ED Notes (Signed)
 Siadecki, MD at bedside to evaluate patient.
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
## 2023-12-29 DIAGNOSIS — S20211A Contusion of right front wall of thorax, initial encounter: Secondary | ICD-10-CM | POA: Diagnosis not present

## 2023-12-29 MED ORDER — HYDROCODONE-ACETAMINOPHEN 5-325 MG PO TABS: 1.0000 | ORAL_TABLET | Freq: Four times a day (QID) | ORAL | 0 refills | Status: AC | PRN

## 2023-12-29 NOTE — ED Notes (Signed)
Patient given discharge instructions including prescriptions x1 and importance of follow up appt as needed with stated understanding. INT removed, cannula intact, pressure dressing applied. Patient stable and ambulatory with steady even gait on dispo.

## 2023-12-29 NOTE — Discharge Instructions (Addendum)
 You may take the hydrocodone  as needed for pain over the next few days.  Return to the ER for any new or worsening chest pain, difficulty breathing, severe headache, or any other new or worsening symptoms that concern you.

## 2024-05-05 ENCOUNTER — Ambulatory Visit
Admission: EM | Admit: 2024-05-05 | Discharge: 2024-05-05 | Disposition: A | Discharge status: Home / Self Care | Attending: Physician Assistant | Admitting: Physician Assistant

## 2024-05-05 ENCOUNTER — Ambulatory Visit

## 2024-05-05 DIAGNOSIS — M25561 Pain in right knee: Secondary | ICD-10-CM

## 2024-05-05 DIAGNOSIS — M79661 Pain in right lower leg: Secondary | ICD-10-CM | POA: Diagnosis not present

## 2024-05-05 DIAGNOSIS — M25461 Effusion, right knee: Secondary | ICD-10-CM

## 2024-05-05 DIAGNOSIS — M1711 Unilateral primary osteoarthritis, right knee: Secondary | ICD-10-CM

## 2024-05-05 DIAGNOSIS — W19XXXA Unspecified fall, initial encounter: Secondary | ICD-10-CM | POA: Diagnosis not present

## 2024-05-05 MED ORDER — HYDROCODONE-ACETAMINOPHEN 5-325 MG PO TABS: 1.0000 | ORAL_TABLET | Freq: Three times a day (TID) | ORAL | 0 refills | Status: AC | PRN

## 2024-05-05 NOTE — Discharge Instructions (Signed)
-   You have a lot of arthritis on your x-ray but I did not see any acute fractures.  However, we will contact you with radiologist sees something acutely abnormal and concerning. - We gave you a brace to use for support and comfort. - Ice and elevate extremity. - I sent something as needed for severe pain. - If you are unable to bear weight and your symptoms or not improving in the next few days you should consider following up with orthopedics to further assess your condition and make sure there are no underlying ligament or meniscus tears.  You have a condition requiring you to follow up with Orthopedics so please call one of the following office for appointment:   [facility] Address: [address], [place], [place] [address] Phone: [phone]  [facility] [address] [place], [place] [address] Phone: [phone]  [facility] [address] [place], [place] [address] Phone: [phone]

## 2024-05-05 NOTE — ED Provider Notes (Signed)
 [facility]    CSN: [id number] Arrival date & time: 05/05/24  9087      History   Chief Complaint Chief Complaint  Patient presents with   Fall    HPI Bianca Wood is a 63 y.o. female presenting for right thigh, knee and lower leg pain/swelling after accidental fall yesterday.  She says that she backed into a block on the floor and fell onto her right side.  She is having some pain bearing weight on the right leg.  Reports most pain around the knee laterally and lower part of her thigh.  Denies any hip pain, back pain, ankle or foot pain.  No numbness, weakness or tingling.  Has been taking Tylenol  and using heat and ice without relief.  No other injuries.  HPI  Past Medical History:  Diagnosis Date   GERD (gastroesophageal reflux disease)    Hypercholesteremia    Hypertension     Patient Active Problem List   Diagnosis Date Noted   Gastro-esophageal reflux disease without esophagitis 03/26/2015   Family history of colon cancer 03/26/2015   Fibroids, intramural 03/18/2015   Disease of thyroid  gland 03/18/2015   Avitaminosis D 10/21/2014   Clinical depression 09/08/2014   Essential (primary) hypertension 09/08/2014   Breast lump 09/08/2014   Arthritis 09/06/2014   Seizure (HCC) 09/06/2014   Absolute anemia 11/23/2013   Chest pain 12/24/2012    Past Surgical History:  Procedure Laterality Date   BREAST BIOPSY     COLONOSCOPY  2015   normal   TUBAL LIGATION      OB History   No obstetric history on file.      Home Medications    Prior to Admission medications  Medication Sig Start Date End Date Taking? Authorizing Provider  HYDROcodone -acetaminophen  (NORCO/VICODIN) 5-325 MG tablet Take 1 tablet by mouth every 8 (eight) hours as needed for up to 3 days. 05/05/24 05/08/24 Yes Arvis Huxley B, PA-C  amLODipine (NORVASC) 10 MG tablet Take 10 mg by mouth daily.    [provider]  amLODipine-benazepril (LOTREL) 10-20 MG capsule Take 1  capsule by mouth daily.    [provider]  atorvastatin (LIPITOR) 10 MG tablet Take 10 mg by mouth daily.    [provider]  ELIQUIS 5 MG TABS tablet Take 5 mg by mouth 2 (two) times daily.    [provider]  hydrochlorothiazide (HYDRODIURIL) 25 MG tablet Take 25 mg by mouth daily.    [provider]  KLOR-CON M10 10 MEQ tablet Take 10 mEq by mouth daily.    [provider]  losartan (COZAAR) 100 MG tablet Take 100 mg by mouth daily.    [provider]  pantoprazole (PROTONIX) 40 MG tablet Take 40 mg by mouth daily.    [provider]    Family History Family History  Problem Relation Age of Onset   Diabetes Mother    CAD Mother    Colon cancer Sister    Cancer Father     Social History Social History[1]   Allergies   Gramineae pollens   Review of Systems Review of Systems  Musculoskeletal:  Positive for arthralgias, gait problem and joint swelling.  Skin:  Negative for color change and wound.  Neurological:  Negative for syncope, weakness and numbness.     Physical Exam Triage Vital Signs ED Triage Vitals  Encounter Vitals Group     BP 05/05/24 1046 (!) 147/87     Girls Systolic BP  Percentile --      Girls Diastolic BP Percentile --      Boys Systolic BP Percentile --      Boys Diastolic BP Percentile --      Pulse Rate 05/05/24 1046 93     Resp 05/05/24 1046 18     Temp 05/05/24 1046 98.2 F (36.8 C)     Temp Source 05/05/24 1046 Oral     SpO2 05/05/24 1046 97 %     Weight 05/05/24 1045 250 lb (113.4 kg)     Height --      Head Circumference --      Peak Flow --      Pain Score 05/05/24 1044 0     Pain Loc --      Pain Education --      Exclude from Growth Chart --    No data found.  Updated Vital Signs BP (!) 147/87 (BP Location: Left Arm)   Pulse 93   Temp 98.2 F (36.8 C) (Oral)   Resp 18   Wt 250 lb (113.4 kg)   SpO2 97%   BMI 41.60 kg/m    Physical Exam Vitals and nursing  note reviewed.  Constitutional:      General: She is not in acute distress.    Appearance: Normal appearance. She is not ill-appearing or toxic-appearing.  HENT:     Head: Normocephalic and atraumatic.  Eyes:     General: No scleral icterus.       Right eye: No discharge.        Left eye: No discharge.     Conjunctiva/sclera: Conjunctivae normal.  Cardiovascular:     Rate and Rhythm: Normal rate.  Pulmonary:     Effort: Pulmonary effort is normal. No respiratory distress.  Musculoskeletal:     Cervical back: Neck supple.     Comments: Right leg: There is mild tenderness of the distal thigh/superior knee without swelling.  Swelling noted of the anterior knee.  Tenderness of the lateral knee joint.  Severe flexion of the knee due to pain mild tenderness of lateral lower leg without swelling.  No wounds, abrasions, contusions.  Full range of motion and no tenderness of hip, ankle or foot.  Good pulses.  Skin:    General: Skin is dry.  Neurological:     General: No focal deficit present.     Mental Status: She is alert. Mental status is at baseline.     Motor: No weakness.     Gait: Gait abnormal.  Psychiatric:        Mood and Affect: Mood normal.        Behavior: Behavior normal.      UC Treatments / Results  Labs (all labs ordered are listed, but only abnormal results are displayed) Labs Reviewed - No data to display  EKG   Radiology DG Knee Complete 4 Views Right Result Date: 05/05/2024 CLINICAL DATA:  Fall yesterday with right knee pain. EXAM: RIGHT KNEE - COMPLETE 4+ VIEW COMPARISON:  11/05/2015 FINDINGS: Moderate tricompartmental osteoarthritic change of the right knee with interval progression. No acute fracture or dislocation. No significant joint effusion. IMPRESSION: 1. No acute findings. 2. Moderate osteoarthritic change. Electronically Signed   By: Toribio Agreste M.D.   On: 05/05/2024 11:55   DG Tibia/Fibula Right Result Date: 05/05/2024 CLINICAL DATA:  Fall  yesterday with right lower leg pain. EXAM: RIGHT TIBIA AND FIBULA - 2 VIEW COMPARISON:  Right knee 11/05/2015 FINDINGS: Moderate osteoarthritic change  of the right knee. No evidence of acute fracture or dislocation. Soft tissues are unremarkable. IMPRESSION: 1. No acute findings. 2. Moderate osteoarthritic change of the right knee. Electronically Signed   By: Toribio Agreste M.D.   On: 05/05/2024 11:54    Procedures Procedures (including critical care time)  Medications Ordered in UC Medications - No data to display  Initial Impression / Assessment and Plan / UC Course  I have reviewed the triage vital signs and the nursing notes.  Pertinent labs & imaging results that were available during my care of the patient were reviewed by me and considered in my medical decision making (see chart for details).   63 year old female presents for right knee and leg pain after accidental fall yesterday.  Difficulty bearing weight due to pain.  Taking Tylenol  and using heat and ice without relief.  Will obtain x-ray of right knee and lower leg to evaluate for possible fracture.  Wet read consistent with arthritis in the knee but no acute findings.  Advised I will contact her if radiology overread differs and there are any acute findings.  Patient was given supportive knee brace.  Advised cryotherapy.  Advised elevation.  Patient is on Eliquis.  Sent Norco as needed for severe pain after reviewing controlled substance database and finding her to be a low risk for abuse.  Advised patient if symptoms not improving in the next few days consider orthopedic follow-up for evaluation of possible underlying ligament or meniscus tear.  X-ray over read consistent with arthritis.  No change to treatment plan.   Final Clinical Impressions(s) / UC Diagnoses   Final diagnoses:  Fall, initial encounter  Pain and swelling of right knee  Pain in right lower leg  Osteoarthritis of right knee, unspecified osteoarthritis  type     Discharge Instructions      - You have a lot of arthritis on your x-ray but I did not see any acute fractures.  However, we will contact you with radiologist sees something acutely abnormal and concerning. - We gave you a brace to use for support and comfort. - Ice and elevate extremity. - I sent something as needed for severe pain. - If you are unable to bear weight and your symptoms or not improving in the next few days you should consider following up with orthopedics to further assess your condition and make sure there are no underlying ligament or meniscus tears.  You have a condition requiring you to follow up with Orthopedics so please call one of the following office for appointment:   [facility] Address: [address], [place], [place] [address] Phone: [phone]  [facility] [address] [place], [place] [address] Phone: [phone]  [facility] [address], [place], [place] [address] Phone: [phone]      ED Prescriptions     Medication Sig Dispense Auth. Provider   HYDROcodone -acetaminophen  (NORCO/VICODIN) 5-325 MG tablet Take 1 tablet by mouth every 8 (eight) hours as needed for up to 3 days. 9 tablet Arvis Jolan NOVAK, PA-C      I have reviewed the PDMP during this encounter.     [1]  Social History Tobacco Use   Smoking status: Former   Smokeless tobacco: Never  Vaping Use   Vaping status: Never Used  Substance Use Topics   Alcohol use: No    Alcohol/week: 0.0 standard drinks of alcohol   Drug use: No     Arvis Jolan NOVAK, PA-C 05/05/24 1222

## 2024-05-05 NOTE — ED Triage Notes (Signed)
 Pt present fall yesterday an injured right leg. Pt state unable to bare weight on the right leg.

## 2024-05-06 ENCOUNTER — Ambulatory Visit (HOSPITAL_COMMUNITY): Payer: Self-pay
